# Patient Record
Sex: Male | Born: 1965 | Race: White | Hispanic: No | Smoking: Former smoker
Health system: Southern US, Community
[De-identification: ages and names within clinical notes are randomized; demographics above are authoritative.]

## PROBLEM LIST (undated history)

## (undated) DIAGNOSIS — F5104 Psychophysiologic insomnia: Secondary | ICD-10-CM

## (undated) DIAGNOSIS — M542 Cervicalgia: Secondary | ICD-10-CM

## (undated) DIAGNOSIS — J069 Acute upper respiratory infection, unspecified: Secondary | ICD-10-CM

## (undated) DIAGNOSIS — E291 Testicular hypofunction: Secondary | ICD-10-CM

## (undated) DIAGNOSIS — J45909 Unspecified asthma, uncomplicated: Secondary | ICD-10-CM

## (undated) DIAGNOSIS — L509 Urticaria, unspecified: Secondary | ICD-10-CM

## (undated) HISTORY — PX: TESTICLE TORSION REDUCTION: SHX795

## (undated) HISTORY — DX: Unspecified asthma, uncomplicated: J45.909

## (undated) HISTORY — DX: Urticaria, unspecified: L50.9

## (undated) HISTORY — PX: CYSTECTOMY: SUR359

## (undated) HISTORY — PX: ORCHIECTOMY: SHX2116

## (undated) HISTORY — DX: Acute upper respiratory infection, unspecified: J06.9

## (undated) HISTORY — DX: Psychophysiologic insomnia: F51.04

## (undated) HISTORY — DX: Testicular hypofunction: E29.1

## (undated) HISTORY — DX: Cervicalgia: M54.2

---

## 2003-12-03 ENCOUNTER — Encounter: Admission: RE | Admit: 2003-12-03 | Discharge: 2003-12-03 | Payer: Self-pay | Admitting: Internal Medicine

## 2003-12-11 ENCOUNTER — Encounter: Admission: RE | Admit: 2003-12-11 | Discharge: 2003-12-11 | Payer: Self-pay | Admitting: Internal Medicine

## 2005-02-16 ENCOUNTER — Ambulatory Visit: Payer: Self-pay | Admitting: Internal Medicine

## 2005-04-05 IMAGING — CR DG CERVICAL SPINE COMPLETE 4+V
5 series · 5 of 5 positions shown · non-contrast
Comparison: none

CLINICAL DATA: Assess for congenital anomaly.

[view not recorded (1 of 5)]
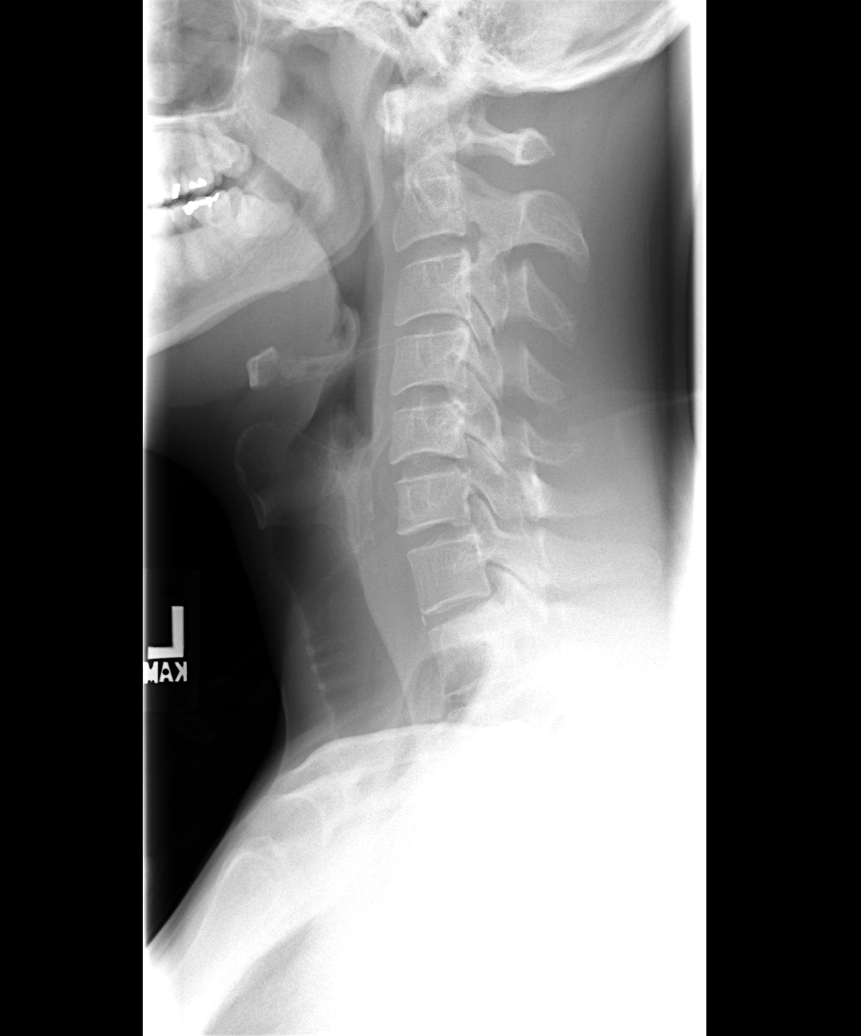

[view not recorded (2 of 5)]
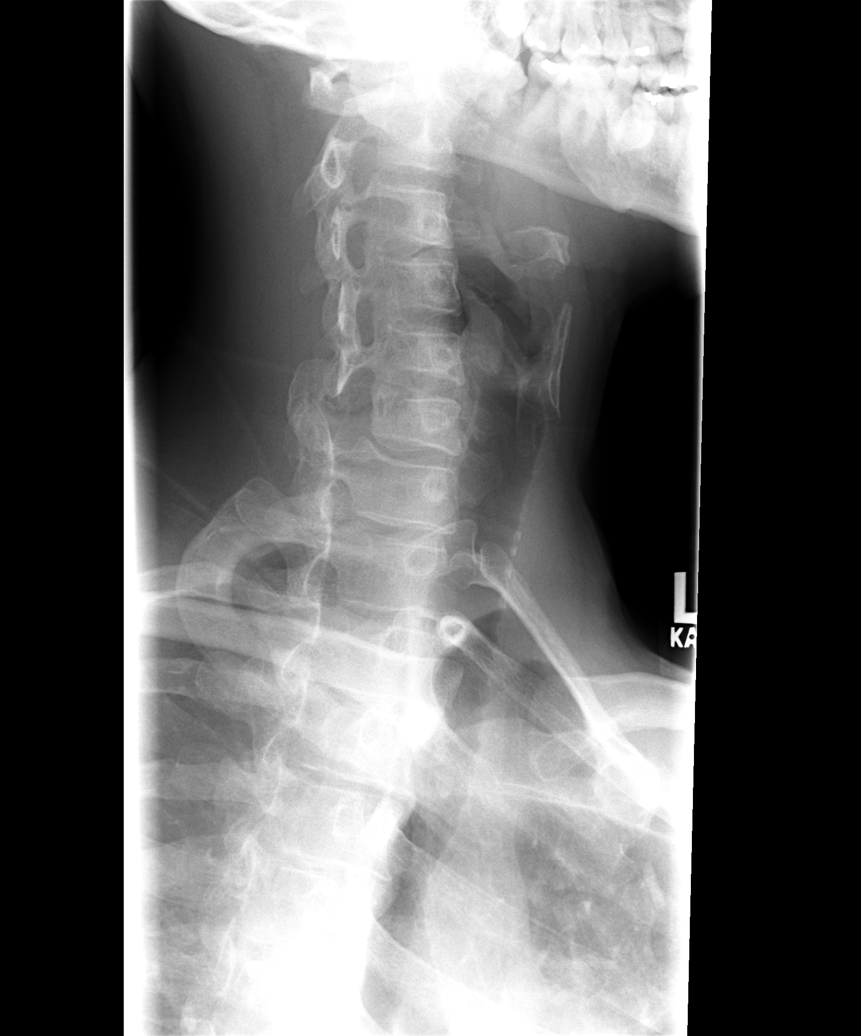

[view not recorded (3 of 5)]
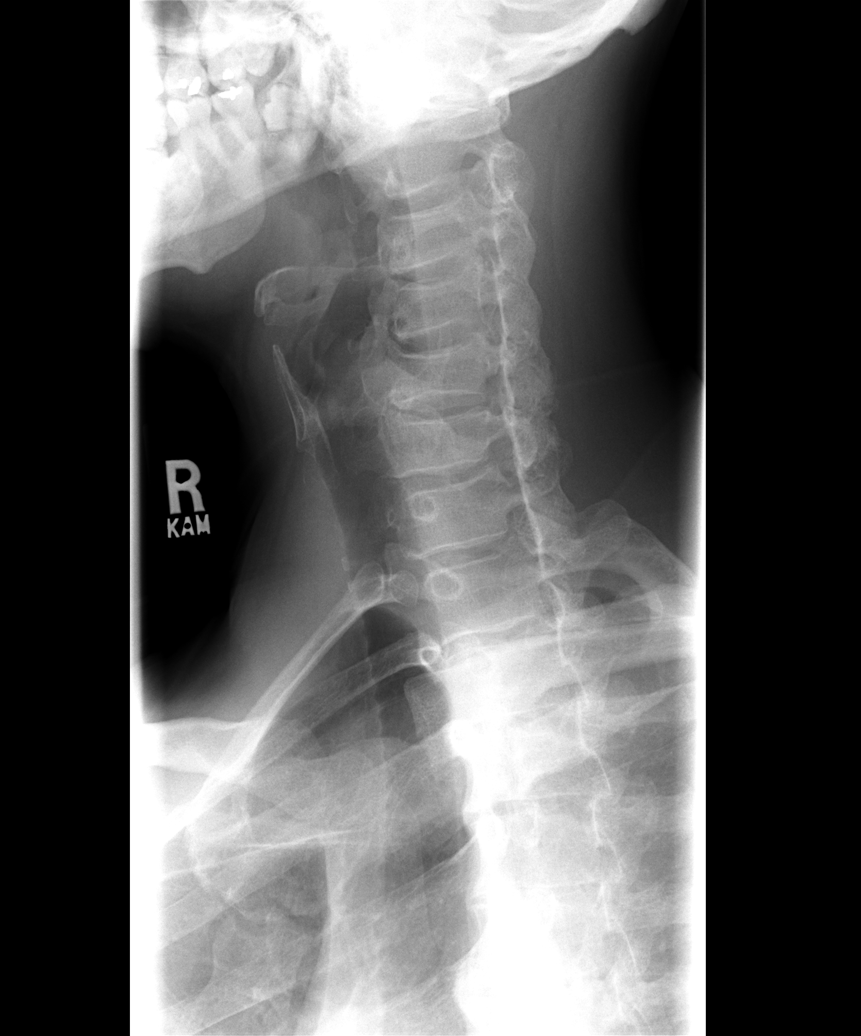

[view not recorded (4 of 5)]
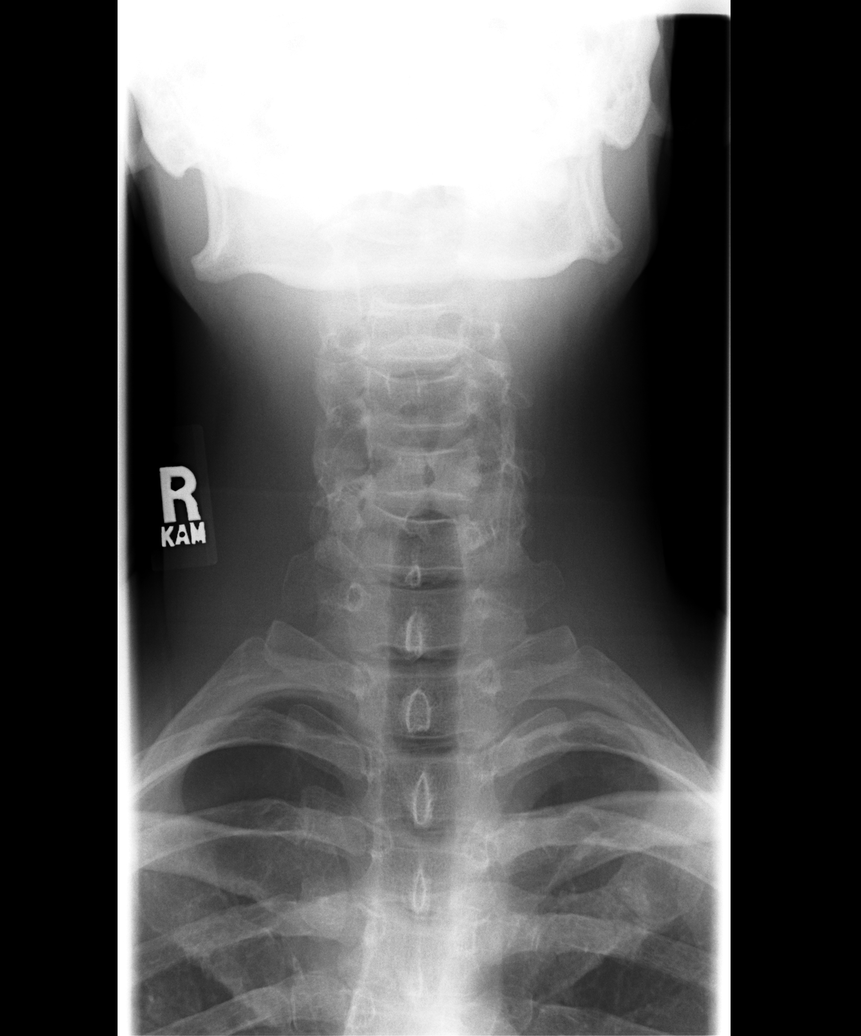

[view not recorded (5 of 5)]
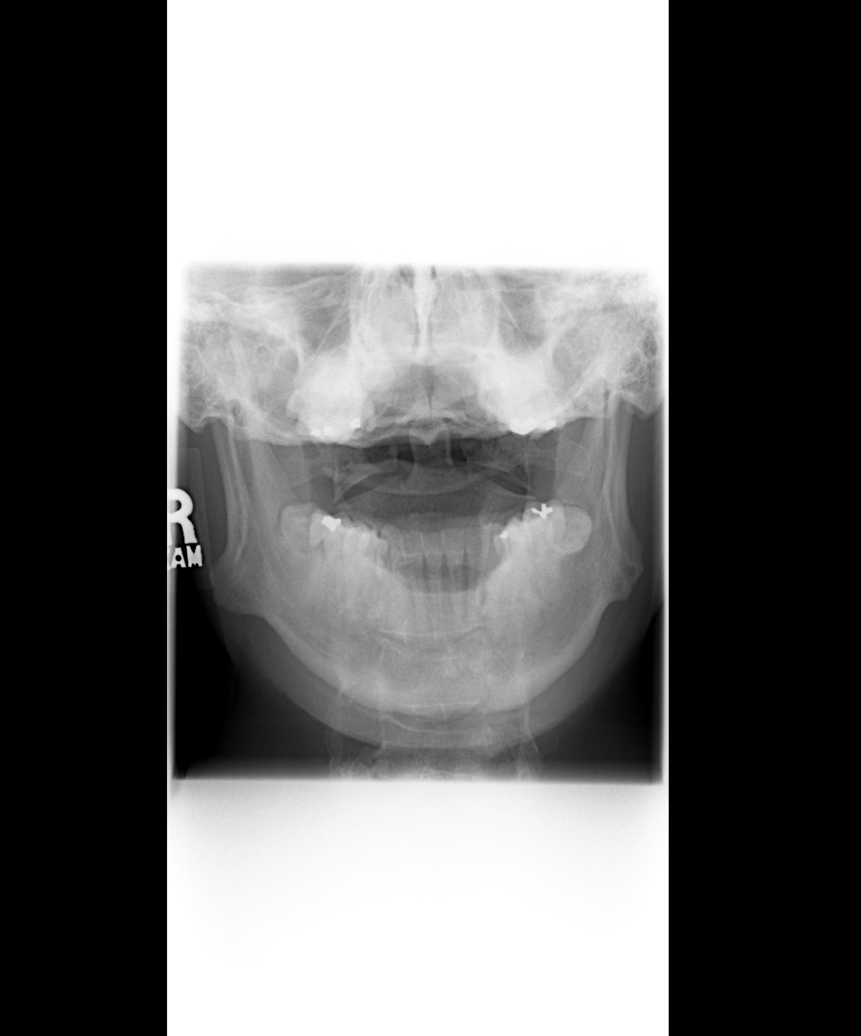

[5 of 5 positions shown; findings below may reference images not displayed]

CERVICAL SPINE ? FIVE VIEWS
As seen on the lateral and AP projections, alignment is normal.  No subluxations.  No disk space narrowing.  The patient has an absent right pedicle at C6.  The facet on the right at C5 articulates with the facet on the right at C7.  There is a giant foramen on the right through which the C6 and C7 nerve roots exit.  I think there are mild degenerative changes of the facets on the left at C5-6 and C6-7.  

IMPRESSION
Absent pedicle on the right at C6.  See above for full discussion.  No sign of bony canal or foraminal stenosis.

## 2010-07-10 ENCOUNTER — Ambulatory Visit: Payer: Self-pay | Admitting: Internal Medicine

## 2010-07-10 DIAGNOSIS — J302 Other seasonal allergic rhinitis: Secondary | ICD-10-CM

## 2010-07-10 DIAGNOSIS — J3089 Other allergic rhinitis: Secondary | ICD-10-CM

## 2010-07-11 ENCOUNTER — Telehealth: Payer: Self-pay | Admitting: Internal Medicine

## 2010-07-15 ENCOUNTER — Ambulatory Visit: Payer: Self-pay | Admitting: Internal Medicine

## 2010-07-15 LAB — CONVERTED CEMR LAB
AST: 29 units/L (ref 0–37)
Basophils Relative: 0.5 % (ref 0.0–3.0)
Bilirubin, Direct: 0.1 mg/dL (ref 0.0–0.3)
Calcium: 9 mg/dL (ref 8.4–10.5)
Cholesterol: 222 mg/dL — ABNORMAL HIGH (ref 0–200)
Eosinophils Absolute: 0.6 10*3/uL (ref 0.0–0.7)
Eosinophils Relative: 10.5 % — ABNORMAL HIGH (ref 0.0–5.0)
HCT: 45.3 % (ref 39.0–52.0)
Hemoglobin: 15.8 g/dL (ref 13.0–17.0)
Lymphocytes Relative: 37.1 % (ref 12.0–46.0)
Monocytes Relative: 10.8 % (ref 3.0–12.0)
Neutro Abs: 2.4 10*3/uL (ref 1.4–7.7)
Potassium: 5.1 meq/L (ref 3.5–5.1)
RDW: 11.8 % (ref 11.5–14.6)
Sodium: 137 meq/L (ref 135–145)
Specific Gravity, Urine: <=1.005 (ref 1.000–1.030)
TSH: 1.45 microintl units/mL (ref 0.35–5.50)
Total Protein, Urine: NEGATIVE mg/dL
Total Protein: 6.7 g/dL (ref 6.0–8.3)
Urobilinogen, UA: 0.2 (ref 0.0–1.0)

## 2010-07-18 ENCOUNTER — Encounter: Payer: Self-pay | Admitting: Internal Medicine

## 2010-07-18 ENCOUNTER — Ambulatory Visit: Payer: Self-pay | Admitting: Internal Medicine

## 2010-07-18 DIAGNOSIS — R5383 Other fatigue: Secondary | ICD-10-CM

## 2010-07-18 DIAGNOSIS — R5381 Other malaise: Secondary | ICD-10-CM

## 2010-07-18 DIAGNOSIS — G4700 Insomnia, unspecified: Secondary | ICD-10-CM

## 2010-07-23 LAB — CONVERTED CEMR LAB: Testosterone: 257.74 ng/dL — ABNORMAL LOW (ref 350.00–890.00)

## 2010-09-02 ENCOUNTER — Ambulatory Visit
Admission: RE | Admit: 2010-09-02 | Discharge: 2010-09-02 | Payer: Self-pay | Source: Home / Self Care | Attending: Internal Medicine | Admitting: Internal Medicine

## 2010-09-15 ENCOUNTER — Ambulatory Visit: Admit: 2010-09-15 | Payer: Self-pay | Admitting: Internal Medicine

## 2010-09-26 ENCOUNTER — Telehealth (INDEPENDENT_AMBULATORY_CARE_PROVIDER_SITE_OTHER): Payer: Self-pay | Admitting: *Deleted

## 2010-09-30 NOTE — Assessment & Plan Note (Signed)
Summary: ALLERGIES-LB   Vital Signs:  Patient profile:   45 year old male Weight:      201 pounds O2 Sat:      98 % on Room air Temp:     97.5 degrees F oral Pulse rate:   63 / minute BP sitting:   128 / 70  (left arm) Cuff size:   regular  Vitals Entered By: Alysia Penna (July 10, 2010 4:35 PM)  O2 Flow:  Room air CC: pt c/o worsening allergies along w/ congestion and sneezing.    CC:  pt c/o worsening allergies along w/ congestion and sneezing. Marland Kitchen  History of Present Illness: Not seen since 2006 C/o allergies - c/o allergies. He has been having severe runny nose episodes at home and outside - worse lately. No wheezing.  Preventive Screening-Counseling & Management  Alcohol-Tobacco     Smoking Status: never  Current Medications (verified): 1)  None  Allergies (verified): No Known Drug Allergies  Past History:  Past Medical History: Allergic rhinitis H/o neck pain  Past Surgical History: Cyst removal  Social History: Occupation:mortgage Electrical engineer Single Never Smoked Alcohol use-yes Smoking Status:  never  Review of Systems  The patient denies anorexia, fever, weight loss, weight gain, vision loss, decreased hearing, hoarseness, chest pain, syncope, dyspnea on exertion, peripheral edema, prolonged cough, headaches, hemoptysis, suspicious skin lesions, enlarged lymph nodes, angioedema, and testicular masses.    Physical Exam  General:  Well-developed,well-nourished,in no acute distress; alert,appropriate and cooperative throughout examination Eyes:  No corneal or conjunctival inflammation noted. EOMI. Perrla.  Ears:  External ear exam shows no significant lesions or deformities.  Otoscopic examination reveals clear canals, tympanic membranes are intact bilaterally without bulging, retraction, inflammation or discharge. Hearing is grossly normal bilaterally. Nose:  Swollen nasal mucosa B with clear d/c Mouth:  Oral mucosa and oropharynx without  lesions or exudates.  Teeth in good repair. Neck:  No deformities, masses, or tenderness noted. Lungs:  Normal respiratory effort, chest expands symmetrically. Lungs are clear to auscultation, no crackles or wheezes. Heart:  Normal rate and regular rhythm. S1 and S2 normal without gallop, murmur, click, rub or other extra sounds. Abdomen:  Bowel sounds positive,abdomen soft and non-tender without masses, organomegaly or hernias noted. Skin:  Intact without suspicious lesions or rashes Psych:  Cognition and judgment appear intact. Alert and cooperative with normal attention span and concentration. No apparent delusions, illusions, hallucinations   Impression & Recommendations:  Problem # 1:  ALLERGIC RHINITIS (ICD-477.9) Assessment Deteriorated  His updated medication list for this problem includes:    Flonase 50 Mcg/act Susp (Fluticasone propionate) .Marland Kitchen... 1 spr each nostr qd as needed    Sudafed 12 Hour 120 Mg Xr12h-tab (Pseudoephedrine hcl) .Marland Kitchen... 1 by mouth two times a day as needed allergies    Zyrtec Allergy 10 Mg Tabs (Cetirizine hcl) .Marland Kitchen... 1 by mouth once daily Zpac if worse See "Patient Instructions".   Complete Medication List: 1)  Flonase 50 Mcg/act Susp (Fluticasone propionate) .Marland Kitchen.. 1 spr each nostr qd as needed 2)  Sudafed 12 Hour 120 Mg Xr12h-tab (Pseudoephedrine hcl) .Marland Kitchen.. 1 by mouth two times a day as needed allergies 3)  Zyrtec Allergy 10 Mg Tabs (Cetirizine hcl) .Marland Kitchen.. 1 by mouth qd 4)  Sudafed 12 Hour 120 Mg Xr12h-tab (Pseudoephedrine hcl) .Marland Kitchen.. 1 by mouth two times a day as needed allergies 5)  Zithromax Z-pak 250 Mg Tabs (Azithromycin) .... As dirrected  Patient Instructions: 1)  Keep return office visit  2)  Labs for well exam - please add Allergy profile region 2 Dx 477 3)  Irrigate sinuses Prescriptions: ZITHROMAX Z-PAK 250 MG TABS (AZITHROMYCIN) as dirrected  #1 x 0   Entered and Authorized by:   Tresa Garter MD   Signed by:   Tresa Garter MD on  07/10/2010   Method used:   Print then Give to Patient   RxID:   [id number] SUDAFED 12 HOUR 120 MG XR12H-TAB (PSEUDOEPHEDRINE HCL) 1 by mouth two times a day as needed allergies  #60 x 1   Entered and Authorized by:   Tresa Garter MD   Signed by:   Tresa Garter MD on 07/10/2010   Method used:   Print then Give to Patient   RxID:   [id number] SUDAFED 12 HOUR 120 MG XR12H-TAB (PSEUDOEPHEDRINE HCL) 1 by mouth two times a day as needed allergies  #60 x 1   Entered and Authorized by:   Tresa Garter MD   Signed by:   Tresa Garter MD on 07/10/2010   Method used:   Print then Give to Patient   RxID:   [id number] FLONASE 50 MCG/ACT SUSP (FLUTICASONE PROPIONATE) 1 spr each nostr qd as needed  #3 x 3   Entered and Authorized by:   Tresa Garter MD   Signed by:   Tresa Garter MD on 07/10/2010   Method used:   Print then Give to Patient   RxID:   [id number]    Orders Added: 1)  New Patient Level II [06301]

## 2010-09-30 NOTE — Progress Notes (Signed)
Summary: PHARM  Phone Note From Pharmacy   Caller: [facility] Summary of Call: Pharm called, Pt also wants rx for sudafed (non 12 hour) Initial call taken by: Lamar Sprinkles, CMA,  July 11, 2010 11:28 AM  Follow-up for Phone Call        ok Follow-up by: Tresa Garter MD,  July 11, 2010 1:31 PM    New/Updated Medications: SUDAFED 30 MG TABS (PSEUDOEPHEDRINE HCL) 1-2 by mouth three times a day as needed congestion Prescriptions: SUDAFED 30 MG TABS (PSEUDOEPHEDRINE HCL) 1-2 by mouth three times a day as needed congestion  #100 x 3   Entered by:   Lamar Sprinkles, CMA   Authorized by:   Tresa Garter MD   Signed by:   Lamar Sprinkles, CMA on 07/11/2010   Method used:   Electronically to        [facility]  [phone]* (retail)       [address]       [place], [place]  [address]       Ph: [phone] or [phone]       Fax: [phone]   RxID:   [id number]

## 2010-09-30 NOTE — Assessment & Plan Note (Signed)
Summary: cpx-lb   Vital Signs:  Patient profile:   45 year old male Height:      69 inches Weight:      203 pounds BMI:     30.09 Temp:     98.5 degrees F oral Pulse rate:   68 / minute Pulse rhythm:   regular Resp:     16 per minute BP sitting:   120 / 72  (left arm) Cuff size:   regular  Vitals Entered By: Lanier Prude, Beverly Gust) (July 18, 2010 10:47 AM) CC: CPX Is Patient Diabetic? No   CC:  CPX.  History of Present Illness: The patient presents for a preventive health examination C/o insomnia lately   Current Medications (verified): 1)  Flonase 50 Mcg/act Susp (Fluticasone Propionate) .Marland Kitchen.. 1 Spr Each Nostr Qd As Needed 2)  Sudafed 12 Hour 120 Mg Xr12h-Tab (Pseudoephedrine Hcl) .Marland Kitchen.. 1 By Mouth Two Times A Day As Needed Allergies 3)  Zyrtec Allergy 10 Mg Tabs (Cetirizine Hcl) .Marland Kitchen.. 1 By Mouth Qd 4)  Sudafed 12 Hour 120 Mg Xr12h-Tab (Pseudoephedrine Hcl) .Marland Kitchen.. 1 By Mouth Two Times A Day As Needed Allergies 5)  Sudafed 30 Mg Tabs (Pseudoephedrine Hcl) .Marland Kitchen.. 1-2 By Mouth Three Times A Day As Needed Congestion  Allergies (verified): No Known Drug Allergies  Past History:  Past Medical History: Last updated: 07/10/2010 Allergic rhinitis H/o neck pain  Past Surgical History: Cyst removal Testicular torsion L; s/p orchiectomy  Family History: M CAD (G Burk[place]ter)  Social History: Occupation:mortgage Electrical engineer Single lives w/girlfriend (cats and a dod in the house) Never Smoked Alcohol use-yes  Review of Systems  The patient denies anorexia, fever, weight loss, weight gain, vision loss, decreased hearing, hoarseness, chest pain, syncope, dyspnea on exertion, peripheral edema, prolonged cough, headaches, hemoptysis, abdominal pain, melena, hematochezia, severe indigestion/heartburn, hematuria, incontinence, genital sores, muscle weakness, suspicious skin lesions, transient blindness, difficulty walking, depression, unusual weight change, abnormal bleeding,  enlarged lymph nodes, angioedema, and breast masses.    Physical Exam  General:  Well-developed,well-nourished,in no acute distress; alert,appropriate and cooperative throughout examination Head:  Normocephalic and atraumatic without obvious abnormalities. No apparent alopecia or balding. Eyes:  No corneal or conjunctival inflammation noted. EOMI. Perrla.  Ears:  External ear exam shows no significant lesions or deformities.  Otoscopic examination reveals clear canals, tympanic membranes are intact bilaterally without bulging, retraction, inflammation or discharge. Hearing is grossly normal bilaterally. Nose:  Swollen nasal mucosa B with clear d/c - better Mouth:  Oral mucosa and oropharynx without lesions or exudates.  Teeth in good repair. Neck:  No deformities, masses, or tenderness noted. Lungs:  Normal respiratory effort, chest expands symmetrically. Lungs are clear to auscultation, no crackles or wheezes. Heart:  Normal rate and regular rhythm. S1 and S2 normal without gallop, murmur, click, rub or other extra sounds. Abdomen:  Bowel sounds positive,abdomen soft and non-tender without masses, organomegaly or hernias noted. Msk:  No deformity or scoliosis noted of thoracic or lumbar spine.   Pulses:  R and L carotid,radial,femoral,dorsalis pedis and posterior tibial pulses are full and equal bilaterally Neurologic:  No cranial nerve deficits noted. Station and gait are normal. Plantar reflexes are down-going bilaterally. DTRs are symmetrical throughout. Sensory, motor and coordinative functions appear intact. Skin:  Intact without suspicious lesions or rashes Cervical Nodes:  No lymphadenopathy noted Inguinal Nodes:  No significant adenopathy Psych:  Cognition and judgment appear intact. Alert and cooperative with normal attention span and concentration. No apparent delusions, illusions, hallucinations  Impression & Recommendations:  Problem # 1:  ROUTINE GENERAL MEDICAL EXAM@HEALTH   CARE FACL (ICD-V70.0) Assessment New Health and age related issues were discussed. Available screening tests and vaccinations were discussed as well. Healthy life style including good diet and exercise was discussed.  The labs were reviewed with the patient.  Orders: EKG w/ Interpretation (93000) OK  Problem # 2:  ALLERGIC RHINITIS (ICD-477.9) Assessment: Improved Use Benadryl at hs Sinus rinse The following medications were removed from the medication list:    Sudafed 12 Hour 120 Mg Xr12h-tab (Pseudoephedrine hcl) .Marland Kitchen... 1 by mouth two times a day as needed allergies His updated medication list for this problem includes:    Flonase 50 Mcg/act Susp (Fluticasone propionate) .Marland Kitchen... 1 spr each nostr qd as needed    Zyrtec Allergy 10 Mg Tabs (Cetirizine hcl) .Marland Kitchen... 1 by mouth qd    Sudafed 12 Hour 120 Mg Xr12h-tab (Pseudoephedrine hcl) .Marland Kitchen... 1 by mouth two times a day as needed allergies    Sudafed 30 Mg Tabs (Pseudoephedrine hcl) .Marland Kitchen... 1-2 by mouth three times a day as needed congestion    Diphenhydramine Hcl 25 Mg Caps (Diphenhydramine hcl) .Marland Kitchen... 1 at hs  Orders: Pulmonary Referral (Pulmonary)  Problem # 3:  INSOMNIA, CHRONIC (ICD-307.42) Assessment: New Use Benadryl at hs prn  Complete Medication List: 1)  Flonase 50 Mcg/act Susp (Fluticasone propionate) .Marland Kitchen.. 1 spr each nostr qd as needed 2)  Zyrtec Allergy 10 Mg Tabs (Cetirizine hcl) .Marland Kitchen.. 1 by mouth qd 3)  Sudafed 12 Hour 120 Mg Xr12h-tab (Pseudoephedrine hcl) .Marland Kitchen.. 1 by mouth two times a day as needed allergies 4)  Sudafed 30 Mg Tabs (Pseudoephedrine hcl) .Marland Kitchen.. 1-2 by mouth three times a day as needed congestion 5)  Singulair 10 Mg Tabs (Montelukast sodium) .Marland Kitchen.. 1 by mouth qd 6)  Diphenhydramine Hcl 25 Mg Caps (Diphenhydramine hcl) .Marland Kitchen.. 1 at hs 7)  Aspirin 81 Mg Tbec (Aspirin) .Marland Kitchen.. 1 by mouth qd  Other Orders: TLB-Testosterone, Total (84403-TESTO) Flu Vaccine 9yrs + (57846) Admin 1st Vaccine (96295)  Patient Instructions: 1)   Please schedule a follow-up appointment in 6 months. 2)  BMP prior to visit, ICD-9: 3)  Lipid Panel prior to visit, ICD-9: 272.0 Prescriptions: SINGULAIR 10 MG TABS (MONTELUKAST SODIUM) 1 by mouth qd  #30 x 11   Entered and Authorized by:   Tresa Garter MD   Signed by:   Tresa Garter MD on 07/18/2010   Method used:   Print then Give to Patient   RxID:   [id number]    Orders Added: 1)  EKG w/ Interpretation [93000] 2)  TLB-Testosterone, Total [84403-TESTO] 3)  Flu Vaccine 76yrs + [90658] 4)  Admin 1st Vaccine [90471] 5)  Pulmonary Referral [Pulmonary] 6)  Est. Patient 40-64 years [99396]   Immunizations Administered:  Influenza Vaccine # 1:    Vaccine Type: Fluvax 3+    Site: left deltoid    Mfr: Sanofi Pasteur    Dose: 0.5 ml    Route: IM    Given by: Lanier Prude, CMA(AAMA)    Exp. Date: 02/28/2011    Lot #: [id number]    VIS given: 03/25/10 version given July 18, 2010.   Immunizations Administered:  Influenza Vaccine # 1:    Vaccine Type: Fluvax 3+    Site: left deltoid    Mfr: Sanofi Pasteur    Dose: 0.5 ml    Route: IM    Given by: Lanier Prude, CMA(AAMA)    Exp. Date: 02/28/2011  Lot #: [id number]    VIS given: 03/25/10 version given July 18, 2010.

## 2010-10-01 ENCOUNTER — Encounter: Payer: Self-pay | Admitting: Internal Medicine

## 2010-10-01 ENCOUNTER — Ambulatory Visit (INDEPENDENT_AMBULATORY_CARE_PROVIDER_SITE_OTHER): Payer: BC Managed Care – PPO | Admitting: Internal Medicine

## 2010-10-01 ENCOUNTER — Ambulatory Visit: Admit: 2010-10-01 | Payer: Self-pay | Admitting: Internal Medicine

## 2010-10-01 DIAGNOSIS — G4700 Insomnia, unspecified: Secondary | ICD-10-CM

## 2010-10-01 DIAGNOSIS — J309 Allergic rhinitis, unspecified: Secondary | ICD-10-CM

## 2010-10-02 DIAGNOSIS — J301 Allergic rhinitis due to pollen: Secondary | ICD-10-CM

## 2010-10-02 NOTE — Assessment & Plan Note (Signed)
Summary: severe allergies/mhh   Vital Signs:  Patient profile:   45 year old male Height:      69 inches Weight:      201.13 pounds BMI:     29.81 O2 Sat:      100 % on Room air Pulse rate:   65 / minute BP sitting:   108 / 80  (left arm) Cuff size:   large  Vitals Entered By: Reynaldo Minium CMA (September 02, 2010 10:07 AM)  O2 Flow:  Room air   Primary Provider/Referring Provider:  Plotnikov   History of Present Illness: September 02, 2010-  [name] seen on kind referral from Dr Posey Rea with concern about allergy.  Former smoker who describes onset of seasonal allergy in his 44's. Gradually more perennial. For several years had recurrent sinus infections, butthat pattern has faded. Describes nasal congestion and some sneeze with watery eyes. Has been taking daily zyrtec x 2 years and wih exacerbation will take 2 daily. Temperature significant watery rhinorhea and sneeze. 3-4 episodes each year put hnim to bed. Clairitin used to help but no longer. Occasional minimal dyspnea and wheezes a little around animals. Suspects his paet cats may aggravate existing symptoms. Has Neti pot and flonase with limited benefit. Given Singulair but afraid to try it because of report of mood change. Sudafed some help, but significantly aggravates chronic insomnia. Describes difficulty initiating and maintaining sleep, then sometimes drowsy in day and irritable.  Denies problems with latex, aspirin, foods, insect stings.  Lives in a house, no basement, carpeted with no mold. 3 cats- dog recently died. Air purifiers at home and at work.  Allergy profile  07/15/10- broadly elevated for common inhalent allergens, with total IgE high at 945.7.  Preventive Screening-Counseling & Management  Alcohol-Tobacco     Smoking Status: quit     Packs/Day: 1.0     Year Started: age 31     Year Quit: 2006  Current Medications (verified): 1)  Flonase 50 Mcg/act Susp (Fluticasone Propionate) .Marland Kitchen.. 1 Spr Each Nostr Qd As  Needed 2)  Zyrtec Allergy 10 Mg Tabs (Cetirizine Hcl) .Marland Kitchen.. 1 By Mouth Qd 3)  Sudafed 30 Mg Tabs (Pseudoephedrine Hcl) .Marland Kitchen.. 1-2 By Mouth Three Times A Day As Needed Congestion 4)  Singulair 10 Mg Tabs (Montelukast Sodium) .Marland Kitchen.. 1 By Mouth Qd 5)  Diphenhydramine Hcl 25 Mg Caps (Diphenhydramine Hcl) .Marland Kitchen.. 1 At At Bedtime As Needed 6)  Aspirin 81 Mg Tbec (Aspirin) .Marland Kitchen.. 1 By Mouth Qd  Allergies (verified): No Known Drug Allergies  Family History: M CAD (G Burkhalter)  Family hx of Seasonal Allergies Heart Disease: mother-quad bypass, maternal grandmother-Mi and stroke.  Social History: Occupation:mortgage Electrical engineer Single lives w/girlfriend (cats in the house) Alcohol use-yes Patient states former smoker. States he quit approx 2006. Smoking Status:  quit Packs/Day:  1.0  Review of Systems      See HPI       The patient complains of shortness of breath with activity, shortness of breath at rest, headaches, and nasal congestion/difficulty breathing through nose.  The patient denies productive cough, non-productive cough, coughing up blood, chest pain, irregular heartbeats, acid heartburn, indigestion, loss of appetite, weight change, abdominal pain, difficulty swallowing, sore throat, tooth/dental problems, sneezing, itching, ear ache, anxiety, depression, hand/feet swelling, joint stiffness or pain, rash, change in color of mucus, and fever.    Physical Exam  Additional Exam:  General: A/Ox3; pleasant and cooperative, NAD, wdwn SKIN: no rash, lesions NODES:  no lymphadenopathy HEENT: [place]/AT, EOM- WNL, Conjuctivae- clear, PERRLA, TM-WNL, Nose- mucus bridging, Throat- clear and wnl, Mallampati  III, no pnd NECK: Supple w/ fair ROM, JVD- none, normal carotid impulses w/o bruits Thyroid- normal to palpation CHEST: Clear to P&A HEART: RRR, no m/g/r heard ABDOMEN: Soft and nl; nml bowel sounds; no organomegaly or masses noted WGN:FAOZ, nl pulses, no edema  NEURO: Grossly intact to  observation      Impression & Recommendations:  Problem # 1:  ALLERGIC RHINITIS (ICD-477.9) Significant hx c/w allergic rhinits which has changed from seasonal to perennial. So far no asthma, but hints this may still appear. We will bring him back for allergy skin testing.  The following medications were removed from the medication list:    Sudafed 12 Hour 120 Mg Xr12h-tab (Pseudoephedrine hcl) .Marland Kitchen... 1 by mouth two times a day as needed allergies His updated medication list for this problem includes:    Flonase 50 Mcg/act Susp (Fluticasone propionate) .Marland Kitchen... 1 spr each nostr qd as needed    Zyrtec Allergy 10 Mg Tabs (Cetirizine hcl) .Marland Kitchen... 1 by mouth qd    Sudafed 30 Mg Tabs (Pseudoephedrine hcl) .Marland Kitchen... 1-2 by mouth three times a day as needed congestion    Diphenhydramine Hcl 25 Mg Caps (Diphenhydramine hcl) .Marland Kitchen... 1 at at bedtime as needed  Problem # 2:  INSOMNIA, CHRONIC (ICD-307.42) We discussed this today only to the extent that nasal discomfort and stimulant decongestants might be pertinent. Benadryl at night didn't work well.   Medications Added to Medication List This Visit: 1)  Diphenhydramine Hcl 25 Mg Caps (Diphenhydramine hcl) .Marland Kitchen.. 1 at at bedtime as needed  Other Orders: Consultation Level IV (30865)  Patient Instructions: 1)  Return as able for allergy skin testing. Stop all antihistamines 3 days before skin testing, including cold and allergy meds, otc sleep and cough meds.  2)  Consider resuming your flonase/ fluticasone regularly  3)    1-2 puffs each nostril, one daily at bedtime 4)  Consider otc nasal spray Nasalcrom/ cromolyn   Orders Added: 1)  Consultation Level IV [78469]

## 2010-10-02 NOTE — Progress Notes (Signed)
Summary: meds/ allergy testing  Phone Note Call from Patient Call back at Home Phone [phone]   Caller: Patient Call For: young Summary of Call: pt wants to know if he can continue taking the following medicines prior to allergy testing next week: sudafed/ melatonin / flonase/ and nasalcrom. pt # [id number] Initial call taken by: Tivis Ringer, CNA,  September 26, 2010 8:57 AM  Follow-up for Phone Call        Pt instructed not to take sudafed, flonase or nasalcrom 3 days prior to allergy testing.  Melatonin is ok. Abigail Miyamoto RN  September 26, 2010 9:44 AM

## 2010-10-08 NOTE — Assessment & Plan Note (Signed)
Summary: alt/sh  Aware of allergy protocol   Vital Signs:  Patient profile:   45 year old male Height:      69 inches Weight:      195.38 pounds BMI:     28.96 O2 Sat:      98 % on Room air Pulse rate:   76 / minute BP sitting:   118 / 68  (left arm) Cuff size:   regular  Vitals Entered By: Reynaldo Minium CMA (October 01, 2010 3:30 PM)  O2 Flow:  Room air CC: Allergy Testing.   Primary Provider/Referring Provider:  Plotnikov  CC:  Allergy Testing.Marland Kitchen  History of Present Illness: History of Present Illness: September 02, 2010-  [name] seen on kind referral from Dr Posey Rea with concern about allergy.  Former smoker who describes onset of seasonal allergy in his 45's. Gradually more perennial. For several years had recurrent sinus infections, butthat pattern has faded. Describes nasal congestion and some sneeze with watery eyes. Has been taking daily zyrtec x 2 years and wih exacerbation will take 2 daily. Temperature significant watery rhinorhea and sneeze. 3-4 episodes each year put hnim to bed. Clairitin used to help but no longer. Occasional minimal dyspnea and wheezes a little around animals. Suspects his paet cats may aggravate existing symptoms. Has Neti pot and flonase with limited benefit. Given Singulair but afraid to try it because of report of mood change. Sudafed some help, but significantly aggravates chronic insomnia. Describes difficulty initiating and maintaining sleep, then sometimes drowsy in day and irritable.  Denies problems with latex, aspirin, foods, insect stings.  Lives in a house, no basement, carpeted with no mold. 3 cats- dog recently died. Air purifiers at home and at work.  Allergy profile  07/15/10- broadly elevated for common inhalent allergens, with total IgE high at 945.7.  October 01, 2010- Allergic rhinitis Nurse-CC: Allergy Testing. Flonase seems to give him headache. Nasalcrom and daily Zyrtec seem to help. No recent cold. Feels frontal sinus  headache and increasing postnasal drip off Zyrtec. Some throat clearing but no cough.  Skin test-   Preventive Screening-Counseling & Management  Alcohol-Tobacco     Smoking Status: quit     Packs/Day: 1.0     Year Started: age 45     Year Quit: 2006  Current Medications (verified): 1)  Zyrtec Allergy 10 Mg Tabs (Cetirizine Hcl) .Marland Kitchen.. 1 By Mouth Qd 2)  Sudafed 30 Mg Tabs (Pseudoephedrine Hcl) .Marland Kitchen.. 1-2 By Mouth Three Times A Day As Needed Congestion 3)  Singulair 10 Mg Tabs (Montelukast Sodium) .Marland Kitchen.. 1 By Mouth Qd 4)  Diphenhydramine Hcl 25 Mg Caps (Diphenhydramine Hcl) .Marland Kitchen.. 1 At At Bedtime As Needed 5)  Nasalcrom 5.2 Mg/act Aers (Cromolyn Sodium) .Marland Kitchen.. 1 Spray in Each Nostril Two Times A Day As Needed  Allergies (verified): 1)  ! * Flonase  Past History:  Past Medical History: Last updated: 07/10/2010 Allergic rhinitis H/o neck pain  Past Surgical History: Last updated: 07/18/2010 Cyst removal Testicular torsion L; s/p orchiectomy  Family History: Last updated: 09/02/2010 M CAD (G Burk[place])  Family hx of Seasonal Allergies Heart Disease: mother-quad bypass, maternal grandmother-Mi and stroke.  Social History: Last updated: 09/02/2010 Occupation:mortgage co manager Single lives w/girlfriend (cats in the house) Alcohol use-yes Patient states former smoker. States he quit approx 2006.  Risk Factors: Smoking Status: quit (10/01/2010) Packs/Day: 1.0 (10/01/2010)  Review of Systems      See HPI       The  patient complains of headaches, nasal congestion/difficulty breathing through nose, and sneezing.  The patient denies shortness of breath with activity, shortness of breath at rest, productive cough, non-productive cough, coughing up blood, chest pain, irregular heartbeats, acid heartburn, indigestion, loss of appetite, weight change, abdominal pain, difficulty swallowing, sore throat, tooth/dental problems, ear ache, hand/feet swelling, joint stiffness or pain, rash,  change in color of mucus, and fever.    Physical Exam  Additional Exam:  General: A/Ox3; pleasant and cooperative, NAD, wdwn SKIN: no rash, lesions NODES: no lymphadenopathy HEENT: /AT, EOM- WNL, Conjuctivae- clear, PERRLA, TM-WNL, Nose- mucus bridging, Throat- clear and wnl, Mallampati  III, no pnd NECK: Supple w/ fair ROM, JVD- none, normal carotid impulses w/o bruits Thyroid-  CHEST: Clear to P&A HEART: RRR, no m/g/r heard ABDOMEN: medium build EAV:WUJW, nl pulses, no edema  NEURO: Grossly intact to observation      Impression & Recommendations:  Problem # 1:  ALLERGIC RHINITIS (ICD-477.9)  The following medications were removed from the medication list:    Flonase 50 Mcg/act Susp (Fluticasone propionate) .Marland Kitchen... 1 spr each nostr qd as needed His updated medication list for this problem includes:    Zyrtec Allergy 10 Mg Tabs (Cetirizine hcl) .Marland Kitchen... 1 by mouth qd    Sudafed 30 Mg Tabs (Pseudoephedrine hcl) .Marland Kitchen... 1-2 by mouth three times a day as needed congestion    Diphenhydramine Hcl 25 Mg Caps (Diphenhydramine hcl) .Marland Kitchen... 1 at at bedtime as needed    Nasalcrom 5.2 Mg/act Aers (Cromolyn sodium) .Marland Kitchen... 1 spray in each nostril two times a day as needed  Medications Added to Medication List This Visit: 1)  Nasalcrom 5.2 Mg/act Aers (Cromolyn sodium) .Marland Kitchen.. 1 spray in each nostril two times a day as needed      Appended Document: alt/sh  Aware of allergy protocol     Primary Provider/Referring Provider:  Plotnikov   History of Present Illness: BP sitting:   118 / 68  (left arm) Cuff size:   regular Vitals Entered By: Reynaldo Minium CMA (October 01, 2010 3:30 PM) O2 Flow:  Room air CC: Allergy Testing. Primary Provider/Referring Provider:  Plotnikov CC:  Allergy Testing.Marland Kitchen History of Present Illness: September 02, 2010-  [name] seen on kind referral from Dr Posey Rea with concern about allergy.  Former smoker who describes onset of seasonal allergy in his 45's.  Gradually more perennial. For several years had recurrent sinus infections, butthat pattern has faded. Describes nasal congestion and some sneeze with watery eyes. Has been taking daily zyrtec x 2 years and wih exacerbation will take 2 daily. Temperature significant watery rhinorhea and sneeze. 3-4 episodes each year put hnim to bed. Clairitin used to help but no longer. Occasional minimal dyspnea and wheezes a little around animals. Suspects his paet cats may aggravate existing symptoms. Has Neti pot and flonase with limited benefit. Given Singulair but afraid to try it because of report of mood change. Sudafed some help, but significantly aggravates chronic insomnia. Describes difficulty initiating and maintaining sleep, then sometimes drowsy in day and irritable.  Denies problems with latex, aspirin, foods, insect stings.  Lives in a house, no basement, carpeted with no mold. 3 cats- dog recently died. Air purifiers at home and at work.  Allergy profile  07/15/10- broadly elevated for common inhalent allergens, with total IgE high at 945.7.  October 01, 2010- Allergic rhinitis Nurse-CC: Allergy Testing. Flonase seems to give him headache. Nasalcrom and daily Zyrtec seem to help. No recent  cold. Feels frontal sinus headache and increasing postnasal drip off Zyrtec. Some throat clearing but no cough.  Skin test- Broadly positive  Preventive Screening-Counseling & Management  Alcohol-Tobacco     Smoking Status: quit     Packs/Day: 1.0     Year Started: age 26     Year Quit: 2006  Allergies: 1)  ! * Flonase  Past History:  Past Medical History: Last updated: 07/10/2010 Allergic rhinitis H/o neck pain  Past Surgical History: Last updated: 07/18/2010 Cyst removal Testicular torsion L; s/p orchiectomy  Family History: Last updated: 09/02/2010 M CAD (G Burk[place]ter)  Family hx of Seasonal Allergies Heart Disease: mother-quad bypass, maternal grandmother-Mi and stroke.  Social  History: Last updated: 09/02/2010 Occupation:mortgage co manager Single lives w/girlfriend (cats in the house) Alcohol use-yes Patient states former smoker. States he quit approx 2006.  Risk Factors: Smoking Status: quit (10/01/2010) Packs/Day: 1.0 (10/01/2010)  Review of Systems      See HPI       The patient complains of nasal congestion/difficulty breathing through nose and sneezing.  The patient denies shortness of breath with activity, shortness of breath at rest, productive cough, non-productive cough, coughing up blood, chest pain, irregular heartbeats, acid heartburn, indigestion, loss of appetite, weight change, abdominal pain, difficulty swallowing, sore throat, tooth/dental problems, headaches, ear ache, hand/feet swelling, rash, change in color of mucus, and fever.    Physical Exam  Additional Exam:  General: A/Ox3; pleasant and cooperative, NAD, wdwn SKIN: no rash, lesions NODES: no lymphadenopathy HEENT: [place]/AT, EOM- WNL, Conjuctivae- clear, PERRLA, TM-WNL, Nose- mucus bridging, Throat- clear and wnl, Mallampati  III, no pnd NECK: Supple w/ fair ROM, JVD- none, normal carotid impulses w/o bruits Thyroid-  CHEST: Clear to P&A HEART: RRR, no m/g/r heard ABDOMEN: medium build UEA:VWUJ, nl pulses, no edema  NEURO: Grossly intact to observation      Impression & Recommendations:  Problem # 1:  ALLERGIC RHINITIS (ICD-477.9)  Significantly positve skin test reactions. Total IgE was 9545.7. Chronic rhinitis with little asthma.  His updated medication list for this problem includes:    Sudafed 30 Mg Tabs (Pseudoephedrine hcl) .Marland Kitchen... 1-2 by mouth three times a day as needed congestion    Zyrtec Allergy 10 Mg Tabs (Cetirizine hcl) .Marland Kitchen... 1 by mouth qd    Diphenhydramine Hcl 25 Mg Caps (Diphenhydramine hcl) .Marland Kitchen... 1 at at bedtime as needed    Nasalcrom 5.2 Mg/act Aers (Cromolyn sodium) .Marland Kitchen... 1 spray in each nostril two times a day as needed    Fluticasone Propionate 50  Mcg/act Susp (Fluticasone propionate) .Marland Kitchen... 1-2 puffs each nostril once daily at bedtime  Problem # 2:  INSOMNIA, CHRONIC (ICD-307.42)  He brings this up again. using melatonin and valerian. He still wakes 3-4 x/ night. difficulty initiating and maintaining sleep. Light sleep sensation. he has tried CBT online and it helps some. We discussed options and will let him try temazepam.   Medications Added to Medication List This Visit: 1)  Fluticasone Propionate 50 Mcg/act Susp (Fluticasone propionate) .Marland Kitchen.. 1-2 puffs each nostril once daily at bedtime 2)  Temazepam 15 Mg Caps (Temazepam) .Marland Kitchen.. 1-2 for sleep if needed  Other Orders: Est. Patient Level III (81191) Allergy Puncture Test (47829)  Patient Instructions: 1)  Please schedule a follow-up appointment in 2 months. 2)  We will start allergy vaccine based on skin test results today. The allergy lab will be calling you soon to set this up.  3)  Try script temazepam for sleep as needed  Prescriptions: FLUTICASONE PROPIONATE 50 MCG/ACT SUSP (FLUTICASONE PROPIONATE) 1-2 puffs each nostril once daily at bedtime  #1 x prn   Entered and Authorized by:   Waymon Budge MD   Signed by:   Waymon Budge MD on 10/01/2010   Method used:   Print then Give to Patient   RxID:   [id number] TEMAZEPAM 15 MG CAPS (TEMAZEPAM) 1-2 for sleep if needed  #30 x 5   Entered and Authorized by:   Waymon Budge MD   Signed by:   Waymon Budge MD on 10/01/2010   Method used:   Print then Give to Patient   RxID:   [id number]    Orders Added: 1)  Est. Patient Level III [84696] 2)  Allergy Puncture Test [95004]

## 2010-10-13 ENCOUNTER — Ambulatory Visit (INDEPENDENT_AMBULATORY_CARE_PROVIDER_SITE_OTHER): Payer: BC Managed Care – PPO

## 2010-10-13 DIAGNOSIS — J301 Allergic rhinitis due to pollen: Secondary | ICD-10-CM

## 2010-10-15 ENCOUNTER — Encounter (INDEPENDENT_AMBULATORY_CARE_PROVIDER_SITE_OTHER): Payer: Self-pay | Admitting: *Deleted

## 2010-10-15 ENCOUNTER — Other Ambulatory Visit: Payer: BC Managed Care – PPO

## 2010-10-15 ENCOUNTER — Other Ambulatory Visit: Payer: Self-pay | Admitting: Internal Medicine

## 2010-10-15 DIAGNOSIS — E7800 Pure hypercholesterolemia, unspecified: Secondary | ICD-10-CM

## 2010-10-15 LAB — BASIC METABOLIC PANEL
Calcium: 9 mg/dL (ref 8.4–10.5)
Chloride: 105 mEq/L (ref 96–112)
Creatinine, Ser: 1.2 mg/dL (ref 0.4–1.5)
Glucose, Bld: 86 mg/dL (ref 70–99)

## 2010-10-15 LAB — LIPID PANEL
HDL: 37 mg/dL — ABNORMAL LOW (ref >39.00)
LDL Cholesterol: 111 mg/dL — ABNORMAL HIGH (ref 0–99)
Total CHOL/HDL Ratio: 5
Triglycerides: 133 mg/dL (ref 0.0–149.0)
VLDL: 26.6 mg/dL (ref 0.0–40.0)

## 2010-10-16 ENCOUNTER — Ambulatory Visit (INDEPENDENT_AMBULATORY_CARE_PROVIDER_SITE_OTHER): Payer: BC Managed Care – PPO

## 2010-10-16 DIAGNOSIS — J301 Allergic rhinitis due to pollen: Secondary | ICD-10-CM

## 2010-10-16 NOTE — Miscellaneous (Signed)
Summary: Intradermal tests/[facility]  Intradermal tests/[facility]   Imported By: Lester Tustin 10/06/2010 10:47:26  _____________________________________________________________________  External Attachment:    Type:   Image     Comment:   External Document

## 2010-10-20 ENCOUNTER — Ambulatory Visit (INDEPENDENT_AMBULATORY_CARE_PROVIDER_SITE_OTHER): Payer: BC Managed Care – PPO | Admitting: Internal Medicine

## 2010-10-20 ENCOUNTER — Encounter: Payer: Self-pay | Admitting: Internal Medicine

## 2010-10-20 ENCOUNTER — Ambulatory Visit (INDEPENDENT_AMBULATORY_CARE_PROVIDER_SITE_OTHER): Payer: BC Managed Care – PPO

## 2010-10-20 DIAGNOSIS — G4700 Insomnia, unspecified: Secondary | ICD-10-CM

## 2010-10-20 DIAGNOSIS — J309 Allergic rhinitis, unspecified: Secondary | ICD-10-CM

## 2010-10-20 DIAGNOSIS — J301 Allergic rhinitis due to pollen: Secondary | ICD-10-CM

## 2010-10-20 DIAGNOSIS — E291 Testicular hypofunction: Secondary | ICD-10-CM

## 2010-10-22 NOTE — Miscellaneous (Signed)
Summary: [facility]   Imported By: Sherian Rein 10/15/2010 13:10:15  _____________________________________________________________________  External Attachment:    Type:   Image     Comment:   External Document

## 2010-10-23 ENCOUNTER — Ambulatory Visit (INDEPENDENT_AMBULATORY_CARE_PROVIDER_SITE_OTHER): Payer: BC Managed Care – PPO

## 2010-10-23 DIAGNOSIS — J301 Allergic rhinitis due to pollen: Secondary | ICD-10-CM

## 2010-10-27 ENCOUNTER — Encounter: Payer: Self-pay | Admitting: Internal Medicine

## 2010-10-27 ENCOUNTER — Ambulatory Visit (INDEPENDENT_AMBULATORY_CARE_PROVIDER_SITE_OTHER): Payer: BC Managed Care – PPO

## 2010-10-27 DIAGNOSIS — J301 Allergic rhinitis due to pollen: Secondary | ICD-10-CM | POA: Insufficient documentation

## 2010-10-28 NOTE — Assessment & Plan Note (Signed)
Summary: 2-3 MO ROV--LB   Vital Signs:  Patient profile:   45 year old male Height:      69 inches Weight:      194 pounds BMI:     28.75 Temp:     97.8 degrees F oral Pulse rate:   68 / minute Pulse rhythm:   regular Resp:     16 per minute BP sitting:   110 / 60  (left arm) Cuff size:   regular  Vitals Entered By: Lanier Prude, CMA(AAMA) (October 20, 2010 2:43 PM) CC: 2-3 mo f/u  Is Patient Diabetic? No Comments pt is not taking Singulair   Primary Care Provider:  Lonn Im  CC:  2-3 mo f/u .  History of Present Illness: The patient presents for a follow up of allergies and insomnia. F/u on low testost  Current Medications (verified): 1)  Sudafed 30 Mg Tabs (Pseudoephedrine Hcl) .Marland Kitchen.. 1-2 By Mouth Three Times A Day As Needed Congestion 2)  Singulair 10 Mg Tabs (Montelukast Sodium) .Marland Kitchen.. 1 By Mouth Qd 3)  Zyrtec Allergy 10 Mg Tabs (Cetirizine Hcl) .Marland Kitchen.. 1 By Mouth Qd 4)  Diphenhydramine Hcl 25 Mg Caps (Diphenhydramine Hcl) .Marland Kitchen.. 1 At At Bedtime As Needed 5)  Nasalcrom 5.2 Mg/act Aers (Cromolyn Sodium) .Marland Kitchen.. 1 Spray in Each Nostril Two Times A Day As Needed 6)  Fluticasone Propionate 50 Mcg/act Susp (Fluticasone Propionate) .Marland Kitchen.. 1-2 Puffs Each Nostril Once Daily At Bedtime 7)  Temazepam 15 Mg Caps (Temazepam) .Marland Kitchen.. 1-2 For Sleep If Needed  Allergies (verified): 1)  ! * Flonase  Past History:  Past Surgical History: Last updated: 07/18/2010 Cyst removal Testicular torsion L; s/p orchiectomy  Social History: Last updated: 09/02/2010 Occupation:mortgage co manager Single lives w/girlfriend (cats in the house) Alcohol use-yes Patient states former smoker. States he quit approx 2006.  Past Medical History: Allergic rhinitis H/o neck pain Hypogonadism 2011  Review of Systems  The patient denies fever, weight loss, weight gain, dyspnea on exertion, and abdominal pain.    Physical Exam  General:  Well-developed,well-nourished,in no acute distress;  alert,appropriate and cooperative throughout examination Ears:  External ear exam shows no significant lesions or deformities.  Otoscopic examination reveals clear canals, tympanic membranes are intact bilaterally without bulging, retraction, inflammation or discharge. Hearing is grossly normal bilaterally. Nose:  Swollen nasal mucosa B with clear d/c - better Mouth:  Oral mucosa and oropharynx without lesions or exudates.  Teeth in good repair. Neck:  No deformities, masses, or tenderness noted. Lungs:  Normal respiratory effort, chest expands symmetrically. Lungs are clear to auscultation, no crackles or wheezes. Heart:  Normal rate and regular rhythm. S1 and S2 normal without gallop, murmur, click, rub or other extra sounds. Abdomen:  Bowel sounds positive,abdomen soft and non-tender without masses, organomegaly or hernias noted. Msk:  No deformity or scoliosis noted of thoracic or lumbar spine.   Neurologic:  No cranial nerve deficits noted. Station and gait are normal. Plantar reflexes are down-going bilaterally. DTRs are symmetrical throughout. Sensory, motor and coordinative functions appear intact. Skin:  Intact without suspicious lesions or rashes Psych:  Cognition and judgment appear intact. Alert and cooperative with normal attention span and concentration. No apparent delusions, illusions, hallucinations   Impression & Recommendations:  Problem # 1:  ALLERGIC RHINITIS (ICD-477.9) Assessment Unchanged F/u w/Dr Maple Hudson -  allergy shots His updated medication list for this problem includes:    Sudafed 30 Mg Tabs (Pseudoephedrine hcl) .Marland Kitchen... 1-2 by mouth three times a day as needed congestion  Zyrtec Allergy 10 Mg Tabs (Cetirizine hcl) .Marland Kitchen... 1 by mouth qd    Diphenhydramine Hcl 25 Mg Caps (Diphenhydramine hcl) .Marland Kitchen... 1 at at bedtime as needed    Nasalcrom 5.2 Mg/act Aers (Cromolyn sodium) .Marland Kitchen... 1 spray in each nostril two times a day as needed    Fluticasone Propionate 50 Mcg/act Susp  (Fluticasone propionate) .Marland Kitchen... 1-2 puffs each nostril once daily at bedtime  Problem # 2:  INSOMNIA, CHRONIC (ICD-307.42) Assessment: Improved On the regimen of medicine(s) reflected in the chart    Problem # 3:  HYPOGONADISM (ICD-257.2) Assessment: New We discussed Rx options. he wants to have it rechecked after resuming healthy and active life style  Problem # 4:  FATIGUE (ICD-780.79) Assessment: Improved The labs were reviewed with the patient.   Complete Medication List: 1)  Sudafed 30 Mg Tabs (Pseudoephedrine hcl) .Marland Kitchen.. 1-2 by mouth three times a day as needed congestion 2)  Singulair 10 Mg Tabs (Montelukast sodium) .Marland Kitchen.. 1 by mouth qd 3)  Zyrtec Allergy 10 Mg Tabs (Cetirizine hcl) .Marland Kitchen.. 1 by mouth qd 4)  Diphenhydramine Hcl 25 Mg Caps (Diphenhydramine hcl) .Marland Kitchen.. 1 at at bedtime as needed 5)  Nasalcrom 5.2 Mg/act Aers (Cromolyn sodium) .Marland Kitchen.. 1 spray in each nostril two times a day as needed 6)  Fluticasone Propionate 50 Mcg/act Susp (Fluticasone propionate) .Marland Kitchen.. 1-2 puffs each nostril once daily at bedtime 7)  Temazepam 15 Mg Caps (Temazepam) .Marland Kitchen.. 1-2 for sleep if needed  Patient Instructions: 1)  Please schedule a follow-up appointment in 1 year well w/labs. 2)  Testosterone level in 1-2 months Dx 780.79 3)  Look up Androgel, Testim   Orders Added: 1)  Est. Patient Level IV [29528]

## 2010-10-30 ENCOUNTER — Ambulatory Visit (INDEPENDENT_AMBULATORY_CARE_PROVIDER_SITE_OTHER): Payer: BC Managed Care – PPO

## 2010-10-30 ENCOUNTER — Encounter: Payer: Self-pay | Admitting: Internal Medicine

## 2010-10-30 DIAGNOSIS — J301 Allergic rhinitis due to pollen: Secondary | ICD-10-CM

## 2010-11-03 ENCOUNTER — Ambulatory Visit (INDEPENDENT_AMBULATORY_CARE_PROVIDER_SITE_OTHER): Payer: BC Managed Care – PPO

## 2010-11-03 ENCOUNTER — Encounter: Payer: Self-pay | Admitting: Internal Medicine

## 2010-11-03 DIAGNOSIS — J301 Allergic rhinitis due to pollen: Secondary | ICD-10-CM

## 2010-11-06 ENCOUNTER — Ambulatory Visit (INDEPENDENT_AMBULATORY_CARE_PROVIDER_SITE_OTHER): Payer: BC Managed Care – PPO

## 2010-11-06 ENCOUNTER — Encounter: Payer: Self-pay | Admitting: Internal Medicine

## 2010-11-06 DIAGNOSIS — J301 Allergic rhinitis due to pollen: Secondary | ICD-10-CM

## 2010-11-06 NOTE — Assessment & Plan Note (Signed)
Summary: ALLERGY/CB  Nurse Visit   Allergies: 1)  ! * Flonase  Orders Added: 1)  Allergy Injection (1) [78295]

## 2010-11-06 NOTE — Miscellaneous (Signed)
Summary: Orders Update   Clinical Lists Changes  Problems: Added new problem of ALLERGIC RHINITIS DUE TO POLLEN (ICD-477.0) Orders: Added new Service order of Allergy Injection (1) (95115) - Signed 

## 2010-11-10 ENCOUNTER — Encounter: Payer: Self-pay | Admitting: Internal Medicine

## 2010-11-10 ENCOUNTER — Ambulatory Visit (INDEPENDENT_AMBULATORY_CARE_PROVIDER_SITE_OTHER): Payer: BC Managed Care – PPO

## 2010-11-10 DIAGNOSIS — J301 Allergic rhinitis due to pollen: Secondary | ICD-10-CM

## 2010-11-11 NOTE — Assessment & Plan Note (Signed)
Summary: ALLERGY/CB  Nurse Visit   Allergies: 1)  ! * Flonase  Orders Added: 1)  Allergy Injection (1) [95115] 

## 2010-11-13 ENCOUNTER — Encounter: Payer: Self-pay | Admitting: Internal Medicine

## 2010-11-13 ENCOUNTER — Ambulatory Visit (INDEPENDENT_AMBULATORY_CARE_PROVIDER_SITE_OTHER): Payer: BC Managed Care – PPO

## 2010-11-13 DIAGNOSIS — J301 Allergic rhinitis due to pollen: Secondary | ICD-10-CM

## 2010-11-17 ENCOUNTER — Encounter: Payer: Self-pay | Admitting: Internal Medicine

## 2010-11-17 ENCOUNTER — Ambulatory Visit (INDEPENDENT_AMBULATORY_CARE_PROVIDER_SITE_OTHER): Payer: BC Managed Care – PPO

## 2010-11-17 DIAGNOSIS — J301 Allergic rhinitis due to pollen: Secondary | ICD-10-CM

## 2010-11-18 ENCOUNTER — Ambulatory Visit (INDEPENDENT_AMBULATORY_CARE_PROVIDER_SITE_OTHER): Payer: BC Managed Care – PPO

## 2010-11-18 DIAGNOSIS — J301 Allergic rhinitis due to pollen: Secondary | ICD-10-CM

## 2010-11-18 NOTE — Assessment & Plan Note (Signed)
Summary: extract/cb  Nurse Visit   Allergies: 1)  ! * Flonase  Orders Added: 1)  [facility],1 or multi Secondary school teacher) [phone]

## 2010-11-18 NOTE — Assessment & Plan Note (Signed)
Summary: ALLERGY/CB  Nurse Visit   Allergies: 1)  ! * Flonase  Orders Added: 1)  Allergy Injection (1) [95115] 

## 2010-11-18 NOTE — Assessment & Plan Note (Signed)
Summary: allergy/cb  Nurse Visit   Allergies: 1)  ! * Flonase  Orders Added: 1)  Allergy Injection (1) [30160]

## 2010-11-20 ENCOUNTER — Ambulatory Visit (INDEPENDENT_AMBULATORY_CARE_PROVIDER_SITE_OTHER): Payer: BC Managed Care – PPO

## 2010-11-20 DIAGNOSIS — J301 Allergic rhinitis due to pollen: Secondary | ICD-10-CM

## 2010-11-24 ENCOUNTER — Ambulatory Visit (INDEPENDENT_AMBULATORY_CARE_PROVIDER_SITE_OTHER): Payer: BC Managed Care – PPO

## 2010-11-24 DIAGNOSIS — J301 Allergic rhinitis due to pollen: Secondary | ICD-10-CM

## 2010-11-27 ENCOUNTER — Ambulatory Visit (INDEPENDENT_AMBULATORY_CARE_PROVIDER_SITE_OTHER): Payer: BC Managed Care – PPO

## 2010-11-27 DIAGNOSIS — J301 Allergic rhinitis due to pollen: Secondary | ICD-10-CM

## 2010-11-27 NOTE — Assessment & Plan Note (Signed)
Summary: Allergy  Nurse Visit   Allergies: 1)  ! * Flonase  Orders Added: 1)  Allergy Injection (1) [84132]

## 2010-12-01 ENCOUNTER — Ambulatory Visit (INDEPENDENT_AMBULATORY_CARE_PROVIDER_SITE_OTHER): Payer: BC Managed Care – PPO | Admitting: Internal Medicine

## 2010-12-01 ENCOUNTER — Ambulatory Visit (INDEPENDENT_AMBULATORY_CARE_PROVIDER_SITE_OTHER): Payer: BC Managed Care – PPO

## 2010-12-01 ENCOUNTER — Encounter: Payer: Self-pay | Admitting: Internal Medicine

## 2010-12-01 DIAGNOSIS — J45909 Unspecified asthma, uncomplicated: Secondary | ICD-10-CM

## 2010-12-01 DIAGNOSIS — J45998 Other asthma: Secondary | ICD-10-CM | POA: Insufficient documentation

## 2010-12-01 DIAGNOSIS — F5104 Psychophysiologic insomnia: Secondary | ICD-10-CM

## 2010-12-01 DIAGNOSIS — G4700 Insomnia, unspecified: Secondary | ICD-10-CM

## 2010-12-01 DIAGNOSIS — J301 Allergic rhinitis due to pollen: Secondary | ICD-10-CM

## 2010-12-01 DIAGNOSIS — J309 Allergic rhinitis, unspecified: Secondary | ICD-10-CM

## 2010-12-01 DIAGNOSIS — J4520 Mild intermittent asthma, uncomplicated: Secondary | ICD-10-CM

## 2010-12-01 NOTE — Patient Instructions (Signed)
Continue building allergy vaccine. At our next office visit we will discuss a final increase.  OK to continue Singulair if it helps  Ok to use otc antihistamines  Ok to continue Temazepam as needed

## 2010-12-01 NOTE — Progress Notes (Signed)
  Subjective:    Patient ID: Jose Mahoney, male    DOB: 06-27-1966, 45 y.o.   MRN: [id number]  HPI 52 yoM former smoker,  followed for chronic insomnia, allergic rhinitis. Last here in October 01, 2010 for allergy testing and started on allergy vaccine. Now building here- at 1:500. We discussed the buildup process, concomittant antihistamines, and anaphyllaxis.  He tried singulair and found in retrospect he was breathing easier in his chest, especially with yard work or Educational psychologist exposure.  Temazepam 15 mg does help sleep, used 1-2x/ week. He feels it helps and is sufficient for his needs for now. Review of Systems See HPI Constitutional:   No weight loss, night sweats,  Fevers, chills, fatigue, lassitude. HEENT:   No headaches,  Difficulty swallowing,  Tooth/dental problems,  Sore throat,               Some- sneezing, itching, , nasal congestion, post nasal drip,   CV:  No chest pain,  Orthopnea, PND, swelling in lower extremities, anasarca, dizziness, palpitations  GI  No heartburn, indigestion, abdominal pain, nausea, vomiting, diarrhea, change in bowel habits, loss of appetite  Resp: Slight shortness of breath with exertion .  No excess mucus, no productive cough,  No non-productive cough,  No coughing up of blood.  No change in color of mucus.  No wheezing.  No chest wall deformity  Skin: no rash or lesions.  GU: no dysuria, change in color of urine, no urgency or frequency.  No flank pain.  MS:  No joint pain or swelling.  No decreased range of motion.  No back pain.  Psych:  No change in mood or affect. No depression or anxiety.  No memory loss.     Objective:   Physical Exam General- Alert, Oriented, Affect-appropriate, Distress- none acute    Not obese  Skin- rash-none, lesions- none, excoriation- none  Lymphadenopathy- none  Head- atraumatic  Eyes- Gross vision intact, PERRLA, conjunctivae clear, secretions  Ears- Normal- Hearing, canals, Tm L ,   R ,  Nose-  Slight stuffiness ,No-  Septal dev, mucus, polyps, erosion, perforation   Throat- Mallampati III , mucosa clear , drainage- none, tonsils- atrophic  Neck- flexible , trachea midline, no stridor , thyroid nl, carotid no bruit  Chest - symmetrical excursion , unlabored     Heart/CV- RRR , no murmur , no gallop  , no rub, nl s1 s2                     - JVD- none , edema- none, stasis changes- none, varices- none     Lung- clear to P&A, wheeze- none, cough- none , dullness-none, rub- none     Chest wall-   Abd- tender-no, distended-no, bowel sounds-present, HSM- no  Br/ Gen/ Rectal- Not done, not indicated  Extrem- cyanosis- none, clubbing, none, atrophy- none, strength- nl  Neuro- grossly intact to observation        Assessment & Plan:

## 2010-12-01 NOTE — Assessment & Plan Note (Addendum)
He is recognizing in retrospect that he has had intermnittent chest tightness and dyspsnea with very little wheeze or cough. Singulair has helped this to a useful degree, and has the cost efficiency benefit that it also helps his rhintis. I don't think he needs a rescue inhaler for now.

## 2010-12-01 NOTE — Assessment & Plan Note (Signed)
Doing well with occasional use of temazepam. He doesn't need dose adjustment or other intervention. Nasal congestion encourages sleep with mouth open and we have discussed snoring.

## 2010-12-01 NOTE — Assessment & Plan Note (Signed)
He is doing well so far with allergy vaccine build up and will continue. We have reviewwed safety/ rik benefit/ goals and symtomatic therapies.

## 2010-12-02 ENCOUNTER — Telehealth: Payer: Self-pay | Admitting: Internal Medicine

## 2010-12-02 NOTE — Telephone Encounter (Signed)
Called, spoke with pt. Pt states he still has the singular rx.  He has not yet taken it to the pharmacy.  Also, states it was written by Dr. Posey Rea.  Advised pt the first step to PA is he needs to take the rx to the pharmacy in order to initiate the PA.  He verbalized understanding of this and is aware PA will probably be sent to Dr. Loren Racer office bc he is the one who wrote the rx.

## 2010-12-02 NOTE — Telephone Encounter (Signed)
Will send message to Lawson Fiscal who is working on PA/rxs today.  Lori, pls advise if you have this PA.  Thank you!

## 2010-12-02 NOTE — Telephone Encounter (Signed)
PA request has not been received from the pharmacy. Pharmacy will need to fax over form.

## 2010-12-04 ENCOUNTER — Ambulatory Visit (INDEPENDENT_AMBULATORY_CARE_PROVIDER_SITE_OTHER): Payer: BC Managed Care – PPO

## 2010-12-04 DIAGNOSIS — J301 Allergic rhinitis due to pollen: Secondary | ICD-10-CM

## 2010-12-08 ENCOUNTER — Ambulatory Visit (INDEPENDENT_AMBULATORY_CARE_PROVIDER_SITE_OTHER): Payer: BC Managed Care – PPO

## 2010-12-08 DIAGNOSIS — J301 Allergic rhinitis due to pollen: Secondary | ICD-10-CM

## 2010-12-10 ENCOUNTER — Telehealth: Payer: Self-pay | Admitting: Internal Medicine

## 2010-12-10 NOTE — Telephone Encounter (Signed)
I advised pt we have not received a PA form for his singulair. I advised pt he needs to call his pharmacy and have them fax the form over so we can initiate the PA. Pt states he will call pharmacy and have them send it over

## 2010-12-11 ENCOUNTER — Ambulatory Visit (INDEPENDENT_AMBULATORY_CARE_PROVIDER_SITE_OTHER): Payer: BC Managed Care – PPO

## 2010-12-11 DIAGNOSIS — J309 Allergic rhinitis, unspecified: Secondary | ICD-10-CM

## 2010-12-15 ENCOUNTER — Ambulatory Visit (INDEPENDENT_AMBULATORY_CARE_PROVIDER_SITE_OTHER): Payer: BC Managed Care – PPO

## 2010-12-15 DIAGNOSIS — J309 Allergic rhinitis, unspecified: Secondary | ICD-10-CM

## 2010-12-16 ENCOUNTER — Telehealth: Payer: Self-pay | Admitting: Internal Medicine

## 2010-12-16 NOTE — Telephone Encounter (Signed)
PA requested for Singulair 10mg  PA form requested & received from [facility] @ [phone] 12/12/10. PA forwarded to MD for Completion 12/13/10 Completed PA form faxed for approval 12/15/10 Approval received & faxed to pharmacy.

## 2010-12-18 ENCOUNTER — Ambulatory Visit (INDEPENDENT_AMBULATORY_CARE_PROVIDER_SITE_OTHER): Payer: BC Managed Care – PPO

## 2010-12-18 DIAGNOSIS — J309 Allergic rhinitis, unspecified: Secondary | ICD-10-CM

## 2010-12-19 ENCOUNTER — Other Ambulatory Visit: Payer: Self-pay | Admitting: Internal Medicine

## 2010-12-19 ENCOUNTER — Other Ambulatory Visit (INDEPENDENT_AMBULATORY_CARE_PROVIDER_SITE_OTHER): Payer: BC Managed Care – PPO

## 2010-12-19 DIAGNOSIS — R5381 Other malaise: Secondary | ICD-10-CM

## 2010-12-19 DIAGNOSIS — R5383 Other fatigue: Secondary | ICD-10-CM

## 2010-12-19 LAB — TESTOSTERONE: Testosterone: 269.57 ng/dL — ABNORMAL LOW (ref 350.00–890.00)

## 2010-12-20 ENCOUNTER — Telehealth: Payer: Self-pay | Admitting: Internal Medicine

## 2010-12-20 NOTE — Telephone Encounter (Signed)
Misty Stanley , please, inform the patient: Jose Mahoney is better but still low  Please, keep  next office visit appointment.   Thank you !

## 2010-12-22 ENCOUNTER — Ambulatory Visit (INDEPENDENT_AMBULATORY_CARE_PROVIDER_SITE_OTHER): Payer: BC Managed Care – PPO

## 2010-12-22 DIAGNOSIS — J309 Allergic rhinitis, unspecified: Secondary | ICD-10-CM

## 2010-12-22 NOTE — Telephone Encounter (Signed)
Pt informed

## 2010-12-25 ENCOUNTER — Ambulatory Visit (INDEPENDENT_AMBULATORY_CARE_PROVIDER_SITE_OTHER): Payer: BC Managed Care – PPO

## 2010-12-25 DIAGNOSIS — J309 Allergic rhinitis, unspecified: Secondary | ICD-10-CM

## 2010-12-29 ENCOUNTER — Ambulatory Visit (INDEPENDENT_AMBULATORY_CARE_PROVIDER_SITE_OTHER): Payer: BC Managed Care – PPO

## 2010-12-29 DIAGNOSIS — J309 Allergic rhinitis, unspecified: Secondary | ICD-10-CM

## 2011-01-01 ENCOUNTER — Ambulatory Visit (INDEPENDENT_AMBULATORY_CARE_PROVIDER_SITE_OTHER): Payer: BC Managed Care – PPO

## 2011-01-01 DIAGNOSIS — J309 Allergic rhinitis, unspecified: Secondary | ICD-10-CM

## 2011-01-05 ENCOUNTER — Ambulatory Visit (INDEPENDENT_AMBULATORY_CARE_PROVIDER_SITE_OTHER): Payer: BC Managed Care – PPO

## 2011-01-05 DIAGNOSIS — J309 Allergic rhinitis, unspecified: Secondary | ICD-10-CM

## 2011-01-06 ENCOUNTER — Ambulatory Visit (INDEPENDENT_AMBULATORY_CARE_PROVIDER_SITE_OTHER): Payer: BC Managed Care – PPO

## 2011-01-06 DIAGNOSIS — J309 Allergic rhinitis, unspecified: Secondary | ICD-10-CM

## 2011-01-08 ENCOUNTER — Ambulatory Visit (INDEPENDENT_AMBULATORY_CARE_PROVIDER_SITE_OTHER): Payer: BC Managed Care – PPO

## 2011-01-08 DIAGNOSIS — J309 Allergic rhinitis, unspecified: Secondary | ICD-10-CM

## 2011-01-12 ENCOUNTER — Ambulatory Visit (INDEPENDENT_AMBULATORY_CARE_PROVIDER_SITE_OTHER): Payer: BC Managed Care – PPO

## 2011-01-12 DIAGNOSIS — J309 Allergic rhinitis, unspecified: Secondary | ICD-10-CM

## 2011-01-14 ENCOUNTER — Other Ambulatory Visit: Payer: Self-pay

## 2011-01-14 ENCOUNTER — Other Ambulatory Visit: Payer: Self-pay | Admitting: Internal Medicine

## 2011-01-14 DIAGNOSIS — E7800 Pure hypercholesterolemia, unspecified: Secondary | ICD-10-CM

## 2011-01-15 ENCOUNTER — Ambulatory Visit (INDEPENDENT_AMBULATORY_CARE_PROVIDER_SITE_OTHER): Payer: BC Managed Care – PPO

## 2011-01-15 DIAGNOSIS — J309 Allergic rhinitis, unspecified: Secondary | ICD-10-CM

## 2011-01-19 ENCOUNTER — Ambulatory Visit (INDEPENDENT_AMBULATORY_CARE_PROVIDER_SITE_OTHER): Payer: BC Managed Care – PPO

## 2011-01-19 DIAGNOSIS — J309 Allergic rhinitis, unspecified: Secondary | ICD-10-CM

## 2011-01-21 ENCOUNTER — Ambulatory Visit: Payer: Self-pay | Admitting: Internal Medicine

## 2011-01-22 ENCOUNTER — Ambulatory Visit (INDEPENDENT_AMBULATORY_CARE_PROVIDER_SITE_OTHER): Payer: BC Managed Care – PPO

## 2011-01-22 DIAGNOSIS — J309 Allergic rhinitis, unspecified: Secondary | ICD-10-CM

## 2011-01-27 ENCOUNTER — Ambulatory Visit (INDEPENDENT_AMBULATORY_CARE_PROVIDER_SITE_OTHER): Payer: BC Managed Care – PPO

## 2011-01-27 DIAGNOSIS — J309 Allergic rhinitis, unspecified: Secondary | ICD-10-CM

## 2011-02-02 ENCOUNTER — Ambulatory Visit (INDEPENDENT_AMBULATORY_CARE_PROVIDER_SITE_OTHER): Payer: BC Managed Care – PPO

## 2011-02-02 ENCOUNTER — Encounter: Payer: Self-pay | Admitting: Internal Medicine

## 2011-02-02 DIAGNOSIS — J309 Allergic rhinitis, unspecified: Secondary | ICD-10-CM

## 2011-02-05 ENCOUNTER — Ambulatory Visit (INDEPENDENT_AMBULATORY_CARE_PROVIDER_SITE_OTHER): Payer: BC Managed Care – PPO

## 2011-02-05 DIAGNOSIS — J309 Allergic rhinitis, unspecified: Secondary | ICD-10-CM

## 2011-02-09 ENCOUNTER — Ambulatory Visit (INDEPENDENT_AMBULATORY_CARE_PROVIDER_SITE_OTHER): Payer: BC Managed Care – PPO

## 2011-02-09 DIAGNOSIS — J309 Allergic rhinitis, unspecified: Secondary | ICD-10-CM

## 2011-02-12 ENCOUNTER — Ambulatory Visit (INDEPENDENT_AMBULATORY_CARE_PROVIDER_SITE_OTHER): Payer: BC Managed Care – PPO

## 2011-02-12 DIAGNOSIS — J309 Allergic rhinitis, unspecified: Secondary | ICD-10-CM

## 2011-02-16 ENCOUNTER — Ambulatory Visit (INDEPENDENT_AMBULATORY_CARE_PROVIDER_SITE_OTHER): Payer: BC Managed Care – PPO

## 2011-02-16 DIAGNOSIS — J309 Allergic rhinitis, unspecified: Secondary | ICD-10-CM

## 2011-02-19 ENCOUNTER — Ambulatory Visit (INDEPENDENT_AMBULATORY_CARE_PROVIDER_SITE_OTHER): Payer: BC Managed Care – PPO

## 2011-02-19 DIAGNOSIS — J309 Allergic rhinitis, unspecified: Secondary | ICD-10-CM

## 2011-02-23 ENCOUNTER — Ambulatory Visit (INDEPENDENT_AMBULATORY_CARE_PROVIDER_SITE_OTHER): Payer: BC Managed Care – PPO

## 2011-02-23 DIAGNOSIS — J309 Allergic rhinitis, unspecified: Secondary | ICD-10-CM

## 2011-02-26 ENCOUNTER — Ambulatory Visit (INDEPENDENT_AMBULATORY_CARE_PROVIDER_SITE_OTHER): Payer: BC Managed Care – PPO

## 2011-02-26 DIAGNOSIS — J309 Allergic rhinitis, unspecified: Secondary | ICD-10-CM

## 2011-03-02 ENCOUNTER — Ambulatory Visit (INDEPENDENT_AMBULATORY_CARE_PROVIDER_SITE_OTHER): Payer: BC Managed Care – PPO

## 2011-03-02 DIAGNOSIS — J309 Allergic rhinitis, unspecified: Secondary | ICD-10-CM

## 2011-03-05 ENCOUNTER — Ambulatory Visit (INDEPENDENT_AMBULATORY_CARE_PROVIDER_SITE_OTHER): Payer: BC Managed Care – PPO

## 2011-03-05 DIAGNOSIS — J309 Allergic rhinitis, unspecified: Secondary | ICD-10-CM

## 2011-03-09 ENCOUNTER — Ambulatory Visit (INDEPENDENT_AMBULATORY_CARE_PROVIDER_SITE_OTHER): Payer: BC Managed Care – PPO

## 2011-03-09 ENCOUNTER — Encounter: Payer: Self-pay | Admitting: Internal Medicine

## 2011-03-09 DIAGNOSIS — J309 Allergic rhinitis, unspecified: Secondary | ICD-10-CM

## 2011-03-12 ENCOUNTER — Ambulatory Visit (INDEPENDENT_AMBULATORY_CARE_PROVIDER_SITE_OTHER): Payer: BC Managed Care – PPO

## 2011-03-12 DIAGNOSIS — J309 Allergic rhinitis, unspecified: Secondary | ICD-10-CM

## 2011-03-16 ENCOUNTER — Ambulatory Visit (INDEPENDENT_AMBULATORY_CARE_PROVIDER_SITE_OTHER): Payer: BC Managed Care – PPO

## 2011-03-16 DIAGNOSIS — J309 Allergic rhinitis, unspecified: Secondary | ICD-10-CM

## 2011-03-20 ENCOUNTER — Ambulatory Visit (INDEPENDENT_AMBULATORY_CARE_PROVIDER_SITE_OTHER): Payer: BC Managed Care – PPO

## 2011-03-20 DIAGNOSIS — J309 Allergic rhinitis, unspecified: Secondary | ICD-10-CM

## 2011-03-23 ENCOUNTER — Telehealth: Payer: Self-pay | Admitting: *Deleted

## 2011-03-23 ENCOUNTER — Ambulatory Visit (INDEPENDENT_AMBULATORY_CARE_PROVIDER_SITE_OTHER): Payer: BC Managed Care – PPO

## 2011-03-23 DIAGNOSIS — J309 Allergic rhinitis, unspecified: Secondary | ICD-10-CM

## 2011-03-23 MED ORDER — EPINEPHRINE 0.3 MG/0.3ML IJ DEVI: 0.3000 mg | Freq: Once | INTRAMUSCULAR | Status: DC | PRN

## 2011-03-23 NOTE — Telephone Encounter (Signed)
I instructed patient on use of Epipen and when to use it. Also, pt has the number to the office to call in the case of an reaction. Pt verbally stated he understood to go to ER if reaction was severe or concerning to him. Then he could contact our office afterwards for further instructions(not to take vaccine until seen by CY).    Rx Sent.

## 2011-03-26 ENCOUNTER — Ambulatory Visit (INDEPENDENT_AMBULATORY_CARE_PROVIDER_SITE_OTHER): Payer: BC Managed Care – PPO

## 2011-03-26 DIAGNOSIS — J309 Allergic rhinitis, unspecified: Secondary | ICD-10-CM

## 2011-03-30 ENCOUNTER — Ambulatory Visit (INDEPENDENT_AMBULATORY_CARE_PROVIDER_SITE_OTHER): Payer: BC Managed Care – PPO

## 2011-03-30 DIAGNOSIS — J309 Allergic rhinitis, unspecified: Secondary | ICD-10-CM

## 2011-04-02 ENCOUNTER — Ambulatory Visit (INDEPENDENT_AMBULATORY_CARE_PROVIDER_SITE_OTHER): Payer: BC Managed Care – PPO | Admitting: Internal Medicine

## 2011-04-02 ENCOUNTER — Encounter: Payer: Self-pay | Admitting: Internal Medicine

## 2011-04-02 ENCOUNTER — Ambulatory Visit (INDEPENDENT_AMBULATORY_CARE_PROVIDER_SITE_OTHER): Payer: BC Managed Care – PPO

## 2011-04-02 VITALS — BP 104/62 | HR 74 | Ht 69.0 in | Wt 200.0 lb

## 2011-04-02 DIAGNOSIS — G4700 Insomnia, unspecified: Secondary | ICD-10-CM

## 2011-04-02 DIAGNOSIS — J309 Allergic rhinitis, unspecified: Secondary | ICD-10-CM

## 2011-04-02 MED ORDER — TEMAZEPAM 15 MG PO CAPS: 15.0000 mg | ORAL_CAPSULE | Freq: Every evening | ORAL | Status: DC | PRN

## 2011-04-02 NOTE — Patient Instructions (Addendum)
We can change your allergy vaccine schedule to weekly now  Try sample maintenance nasal steroid Qnasl     1 spray each nostril every night at bedtime Try sample Astepro nasal antihistamine -  1-2 sprays each nostril up to twice daily as needed     Temazepam refill sent to pharmacy

## 2011-04-02 NOTE — Assessment & Plan Note (Signed)
Doing better but still with room to improve. Plan- ok to get allergy shots once weekly now          Try sample Qnasl           Try sample Astepro

## 2011-04-02 NOTE — Progress Notes (Signed)
Subjective:    Patient ID: Jose Mahoney, male    DOB: 22-Feb-1966, 45 y.o.   MRN: [id number]  HPI    Review of Systems     Objective:   Physical Exam        Assessment & Plan:   Subjective:    Patient ID: Jose Mahoney, male    DOB: 01-23-1966, 45 y.o.   MRN: [id number]  HPI 56 yoM former smoker,  followed for chronic insomnia, allergic rhinitis. Last here in October 01, 2010 for allergy testing and started on allergy vaccine. Now building here- at 1:500. We discussed the buildup process, concomittant antihistamines, and anaphyllaxis.  He tried singulair and found in retrospect he was breathing easier in his chest, especially with yard work or Educational psychologist exposure.  Temazepam 15 mg does help sleep, used 1-2x/ week. He feels it helps and is sufficient for his needs for now.  04/02/11- [name] former smoker,  followed for chronic insomnia, allergic rhinitis. Slowly building allergy vaccine now at 1:50, has had some local reactions. He reports fairly smooth Spring season this year- no significant problems. Holding at 0.3 of 1:50. Had continued twice weekly. Still taking Zyrtec.  Temazepam works well for intermittent chronic insomnia, used 2-3 x/ week.  Vaccine injection record reviewed.  Review of Systems See HPI Constitutional:   No weight loss, night sweats,  Fevers, chills, fatigue, lassitude. HEENT:   No headaches,  Difficulty swallowing,  Tooth/dental problems,  Sore throat,               Some- sneezing, itching, , nasal congestion, post nasal drip,   CV:  No chest pain,  Orthopnea, PND, swelling in lower extremities, anasarca, dizziness, palpitations  GI  No heartburn, indigestion, abdominal pain, nausea, vomiting, diarrhea, change in bowel habits, loss of appetite  Resp: Slight shortness of breath with exertion .  No excess mucus, no productive cough,  No non-productive cough,  No coughing up of blood.  No change in color of mucus.  No wheezing.  No chest wall  deformity  Skin: no rash or lesions.  GU: no dysuria, change in color of urine, no urgency or frequency.  No flank pain.  MS:  No joint pain or swelling.  No decreased range of motion.  No back pain.  Psych:  No change in mood or affect. No depression or anxiety.  No memory loss.     Objective:   Physical Exam General- Alert, Oriented, Affect-appropriate, Distress- none acute Skin- rash-none, lesions- none, excoriation- none Lymphadenopathy- none Head- atraumatic            Eyes- Gross vision intact, PERRLA, conjunctivae clear secretions            Ears- Hearing, canals            Nose- Clear, No-Septal dev, mucus, polyps, erosion, perforation             Throat- Mallampati III , mucosa clear , drainage- none, tonsils- atrophic Neck- flexible , trachea midline, no stridor , thyroid nl, carotid no bruit Chest - symmetrical excursion , unlabored           Heart/CV- RRR , no murmur , no gallop  , no rub, nl s1 s2                           - JVD- none , edema- none, stasis changes- none, varices- none  Lung- clear to P&A, wheeze- none, cough- none , dullness-none, rub- none           Chest wall-  Abd- tender-no, distended-no, bowel sounds-present, HSM- no Br/ Gen/ Rectal- Not done, not indicated Extrem- cyanosis- none, clubbing, none, atrophy- none, strength- nl Neuro- grossly intact to observation        Assessment & Plan:

## 2011-04-05 NOTE — Assessment & Plan Note (Signed)
He is working with occasional temazepam

## 2011-04-08 ENCOUNTER — Ambulatory Visit (INDEPENDENT_AMBULATORY_CARE_PROVIDER_SITE_OTHER): Payer: BC Managed Care – PPO

## 2011-04-08 DIAGNOSIS — J309 Allergic rhinitis, unspecified: Secondary | ICD-10-CM

## 2011-04-14 ENCOUNTER — Ambulatory Visit (INDEPENDENT_AMBULATORY_CARE_PROVIDER_SITE_OTHER): Payer: BC Managed Care – PPO

## 2011-04-14 DIAGNOSIS — J309 Allergic rhinitis, unspecified: Secondary | ICD-10-CM

## 2011-04-21 ENCOUNTER — Ambulatory Visit (INDEPENDENT_AMBULATORY_CARE_PROVIDER_SITE_OTHER): Payer: BC Managed Care – PPO

## 2011-04-21 DIAGNOSIS — J309 Allergic rhinitis, unspecified: Secondary | ICD-10-CM

## 2011-04-28 ENCOUNTER — Ambulatory Visit (INDEPENDENT_AMBULATORY_CARE_PROVIDER_SITE_OTHER): Payer: BC Managed Care – PPO

## 2011-04-28 DIAGNOSIS — J309 Allergic rhinitis, unspecified: Secondary | ICD-10-CM

## 2011-05-01 ENCOUNTER — Encounter: Payer: Self-pay | Admitting: Internal Medicine

## 2011-05-05 ENCOUNTER — Ambulatory Visit (INDEPENDENT_AMBULATORY_CARE_PROVIDER_SITE_OTHER): Payer: BC Managed Care – PPO

## 2011-05-05 DIAGNOSIS — J309 Allergic rhinitis, unspecified: Secondary | ICD-10-CM

## 2011-05-12 ENCOUNTER — Ambulatory Visit (INDEPENDENT_AMBULATORY_CARE_PROVIDER_SITE_OTHER): Payer: BC Managed Care – PPO

## 2011-05-12 DIAGNOSIS — J309 Allergic rhinitis, unspecified: Secondary | ICD-10-CM

## 2011-05-19 ENCOUNTER — Ambulatory Visit (INDEPENDENT_AMBULATORY_CARE_PROVIDER_SITE_OTHER): Payer: BC Managed Care – PPO

## 2011-05-19 DIAGNOSIS — J309 Allergic rhinitis, unspecified: Secondary | ICD-10-CM

## 2011-05-26 ENCOUNTER — Ambulatory Visit (INDEPENDENT_AMBULATORY_CARE_PROVIDER_SITE_OTHER): Payer: BC Managed Care – PPO

## 2011-05-26 DIAGNOSIS — J309 Allergic rhinitis, unspecified: Secondary | ICD-10-CM

## 2011-06-02 ENCOUNTER — Ambulatory Visit (INDEPENDENT_AMBULATORY_CARE_PROVIDER_SITE_OTHER): Payer: BC Managed Care – PPO

## 2011-06-02 DIAGNOSIS — J309 Allergic rhinitis, unspecified: Secondary | ICD-10-CM

## 2011-06-08 ENCOUNTER — Ambulatory Visit (INDEPENDENT_AMBULATORY_CARE_PROVIDER_SITE_OTHER): Payer: BC Managed Care – PPO

## 2011-06-08 DIAGNOSIS — J309 Allergic rhinitis, unspecified: Secondary | ICD-10-CM

## 2011-06-11 ENCOUNTER — Ambulatory Visit (INDEPENDENT_AMBULATORY_CARE_PROVIDER_SITE_OTHER): Payer: BC Managed Care – PPO

## 2011-06-11 DIAGNOSIS — Z23 Encounter for immunization: Secondary | ICD-10-CM

## 2011-06-11 DIAGNOSIS — J309 Allergic rhinitis, unspecified: Secondary | ICD-10-CM

## 2011-06-16 ENCOUNTER — Ambulatory Visit (INDEPENDENT_AMBULATORY_CARE_PROVIDER_SITE_OTHER): Payer: BC Managed Care – PPO

## 2011-06-16 DIAGNOSIS — J309 Allergic rhinitis, unspecified: Secondary | ICD-10-CM

## 2011-06-22 ENCOUNTER — Ambulatory Visit (INDEPENDENT_AMBULATORY_CARE_PROVIDER_SITE_OTHER): Payer: BC Managed Care – PPO

## 2011-06-22 DIAGNOSIS — J309 Allergic rhinitis, unspecified: Secondary | ICD-10-CM

## 2011-06-30 ENCOUNTER — Ambulatory Visit (INDEPENDENT_AMBULATORY_CARE_PROVIDER_SITE_OTHER): Payer: BC Managed Care – PPO

## 2011-06-30 DIAGNOSIS — J309 Allergic rhinitis, unspecified: Secondary | ICD-10-CM

## 2011-07-06 ENCOUNTER — Ambulatory Visit (INDEPENDENT_AMBULATORY_CARE_PROVIDER_SITE_OTHER): Payer: BC Managed Care – PPO

## 2011-07-06 DIAGNOSIS — J309 Allergic rhinitis, unspecified: Secondary | ICD-10-CM

## 2011-07-14 ENCOUNTER — Ambulatory Visit (INDEPENDENT_AMBULATORY_CARE_PROVIDER_SITE_OTHER): Payer: BC Managed Care – PPO

## 2011-07-14 DIAGNOSIS — J309 Allergic rhinitis, unspecified: Secondary | ICD-10-CM

## 2011-07-21 ENCOUNTER — Ambulatory Visit (INDEPENDENT_AMBULATORY_CARE_PROVIDER_SITE_OTHER): Payer: BC Managed Care – PPO

## 2011-07-21 DIAGNOSIS — J309 Allergic rhinitis, unspecified: Secondary | ICD-10-CM

## 2011-07-27 ENCOUNTER — Ambulatory Visit (INDEPENDENT_AMBULATORY_CARE_PROVIDER_SITE_OTHER): Payer: BC Managed Care – PPO

## 2011-07-27 DIAGNOSIS — J309 Allergic rhinitis, unspecified: Secondary | ICD-10-CM

## 2011-08-03 ENCOUNTER — Ambulatory Visit (INDEPENDENT_AMBULATORY_CARE_PROVIDER_SITE_OTHER): Payer: BC Managed Care – PPO

## 2011-08-03 ENCOUNTER — Encounter: Payer: Self-pay | Admitting: Internal Medicine

## 2011-08-03 DIAGNOSIS — J309 Allergic rhinitis, unspecified: Secondary | ICD-10-CM

## 2011-08-10 ENCOUNTER — Ambulatory Visit (INDEPENDENT_AMBULATORY_CARE_PROVIDER_SITE_OTHER): Payer: BC Managed Care – PPO

## 2011-08-10 ENCOUNTER — Telehealth: Payer: Self-pay | Admitting: Internal Medicine

## 2011-08-10 DIAGNOSIS — J309 Allergic rhinitis, unspecified: Secondary | ICD-10-CM

## 2011-08-10 MED ORDER — MONTELUKAST SODIUM 10 MG PO TABS: 10.0000 mg | ORAL_TABLET | Freq: Every day | ORAL | Status: DC

## 2011-08-10 NOTE — Telephone Encounter (Signed)
Pt is aware of Rx has been sent to pharmacy.

## 2011-08-17 ENCOUNTER — Ambulatory Visit (INDEPENDENT_AMBULATORY_CARE_PROVIDER_SITE_OTHER): Payer: BC Managed Care – PPO

## 2011-08-17 DIAGNOSIS — J309 Allergic rhinitis, unspecified: Secondary | ICD-10-CM

## 2011-08-24 ENCOUNTER — Ambulatory Visit (INDEPENDENT_AMBULATORY_CARE_PROVIDER_SITE_OTHER): Payer: BC Managed Care – PPO

## 2011-08-24 DIAGNOSIS — J309 Allergic rhinitis, unspecified: Secondary | ICD-10-CM

## 2011-08-31 ENCOUNTER — Ambulatory Visit (INDEPENDENT_AMBULATORY_CARE_PROVIDER_SITE_OTHER): Payer: BC Managed Care – PPO

## 2011-08-31 DIAGNOSIS — J309 Allergic rhinitis, unspecified: Secondary | ICD-10-CM

## 2011-09-07 ENCOUNTER — Ambulatory Visit (INDEPENDENT_AMBULATORY_CARE_PROVIDER_SITE_OTHER): Payer: BC Managed Care – PPO

## 2011-09-07 DIAGNOSIS — J309 Allergic rhinitis, unspecified: Secondary | ICD-10-CM

## 2011-09-15 ENCOUNTER — Ambulatory Visit (INDEPENDENT_AMBULATORY_CARE_PROVIDER_SITE_OTHER): Payer: BC Managed Care – PPO

## 2011-09-15 DIAGNOSIS — J309 Allergic rhinitis, unspecified: Secondary | ICD-10-CM

## 2011-09-22 ENCOUNTER — Ambulatory Visit (INDEPENDENT_AMBULATORY_CARE_PROVIDER_SITE_OTHER): Payer: BC Managed Care – PPO

## 2011-09-22 DIAGNOSIS — J309 Allergic rhinitis, unspecified: Secondary | ICD-10-CM

## 2011-09-28 ENCOUNTER — Ambulatory Visit (INDEPENDENT_AMBULATORY_CARE_PROVIDER_SITE_OTHER): Payer: BC Managed Care – PPO

## 2011-09-28 DIAGNOSIS — J309 Allergic rhinitis, unspecified: Secondary | ICD-10-CM

## 2011-10-07 ENCOUNTER — Encounter: Payer: Self-pay | Admitting: Internal Medicine

## 2011-10-07 ENCOUNTER — Ambulatory Visit (INDEPENDENT_AMBULATORY_CARE_PROVIDER_SITE_OTHER): Payer: BC Managed Care – PPO | Admitting: Internal Medicine

## 2011-10-07 VITALS — BP 110/72 | HR 61 | Ht 69.0 in | Wt 196.4 lb

## 2011-10-07 DIAGNOSIS — J029 Acute pharyngitis, unspecified: Secondary | ICD-10-CM

## 2011-10-07 DIAGNOSIS — G4700 Insomnia, unspecified: Secondary | ICD-10-CM

## 2011-10-07 DIAGNOSIS — B349 Viral infection, unspecified: Secondary | ICD-10-CM

## 2011-10-07 DIAGNOSIS — B9789 Other viral agents as the cause of diseases classified elsewhere: Secondary | ICD-10-CM

## 2011-10-07 DIAGNOSIS — J309 Allergic rhinitis, unspecified: Secondary | ICD-10-CM

## 2011-10-07 MED ORDER — MONTELUKAST SODIUM 10 MG PO TABS: 10.0000 mg | ORAL_TABLET | Freq: Every day | ORAL | Status: DC

## 2011-10-07 NOTE — Patient Instructions (Signed)
Symptomatic treatment for the sore throat/ cold- extra fluids, throat lozenges, sudafed or combinations if they are helpful.  Please call if needed.  Refill script Singulair  I will have the allergy lab increase your vaccine one more concentration step next time you re-order.

## 2011-10-07 NOTE — Progress Notes (Signed)
Patient ID: Jose Mahoney, male    DOB: [birth year], 46 y.o.   MRN: [id number]  HPI 25 yoM former smoker,  followed for chronic insomnia, allergic rhinitis. Last here in October 01, 2010 for allergy testing and started on allergy vaccine. Now building here- at 1:500. We discussed the buildup process, concomittant antihistamines, and anaphyllaxis.  He tried singulair and found in retrospect he was breathing easier in his chest, especially with yard work or Educational psychologist exposure.  Temazepam 15 mg does help sleep, used 1-2x/ week. He feels it helps and is sufficient for his needs for now.  04/02/11- [name] former smoker,  followed for chronic insomnia, allergic rhinitis. Slowly building allergy vaccine now at 1:50, has had some local reactions. He reports fairly smooth Spring season this year- no significant problems. Holding at 0.3 of 1:50. Had continued twice weekly. Still taking Zyrtec.  Temazepam works well for intermittent chronic insomnia, used 2-3 x/ week.   10/07/11- [name] former smoker,  followed for chronic insomnia, allergic rhinitis, here in October 01, 2010 for allergy testing and started on allergy vaccine. Vaccine injection record reviewed. Acute visit complaining of recent onset sore throat with postnasal drainage some cough with light yellow sputum, no fever. Did get flu vaccine. Has been getting allergy vaccine without problems. We discussed pending spring pollen season but agreed the current illness has been viral.   ROS-see HPI Constitutional:   No-   weight loss, night sweats, fevers, chills, fatigue, lassitude. HEENT:   No-  headaches, difficulty swallowing, tooth/dental problems,   +sore throat,       No-  sneezing, itching, ear ache,  +nasal congestion,  +post nasal drip,  CV:  No-   chest pain, orthopnea, PND, swelling in lower extremities, anasarca, dizziness, palpitations Resp: No-   shortness of breath with exertion or at rest.              No-   productive cough,  No  non-productive cough,  No- coughing up of blood.              No-   change in color of mucus.  No- wheezing.   Skin: No-   rash or lesions. GI:  No-   heartburn, indigestion, abdominal pain, nausea, vomiting, diarrhea,                 change in bowel habits, loss of appetite GU:  MS:  No-   joint pain or swelling.  No- decreased range of motion.  No- back pain. Neuro-     nothing unusual Psych:  No- change in mood or affect. No depression or anxiety.  No memory loss.      Objective:   Physical Exam General- Alert, Oriented, Affect-appropriate, Distress- none acute, fit-appearing Skin- rash-none, lesions- none, excoriation- none Lymphadenopathy- none Head- atraumatic            Eyes- Gross vision intact, PERRLA, conjunctivae clear secretions            Ears- Hearing, canals            Nose- Clear, No-Septal dev, mucus, polyps, erosion, perforation             Throat- Mallampati III , mucosa red , drainage- none, tonsils- atrophic Neck- flexible , trachea midline, no stridor , thyroid nl, carotid no bruit Chest - symmetrical excursion , unlabored           Heart/CV- RRR , no murmur , no gallop  ,  no rub, nl s1 s2                           - JVD- none , edema- none, stasis changes- none, varices- none           Lung- clear to P&A, wheeze- none, cough- none , dullness-none, rub- none           Chest wall-  Abd-  Br/ Gen/ Rectal- Not done, not indicated Extrem- cyanosis- none, clubbing, none, atrophy- none, strength- nl Neuro- grossly intact to observation

## 2011-10-08 ENCOUNTER — Ambulatory Visit: Payer: BC Managed Care – PPO | Admitting: Internal Medicine

## 2011-10-10 DIAGNOSIS — B349 Viral infection, unspecified: Secondary | ICD-10-CM | POA: Insufficient documentation

## 2011-10-10 NOTE — Assessment & Plan Note (Signed)
Discussed symptomatic therapy. This is not a strep throat and there is no place for antibiotics yet unless he develops a sinusitis or purulent bronchitis. He is already beginning to recognize improvement.

## 2011-10-10 NOTE — Assessment & Plan Note (Signed)
We can advance to 1:10 with next vaccine order as discussed.

## 2011-10-10 NOTE — Assessment & Plan Note (Signed)
Had been doing better until he caught the cold. No new intervention offered.

## 2011-10-12 ENCOUNTER — Ambulatory Visit (INDEPENDENT_AMBULATORY_CARE_PROVIDER_SITE_OTHER): Payer: BC Managed Care – PPO

## 2011-10-12 DIAGNOSIS — J309 Allergic rhinitis, unspecified: Secondary | ICD-10-CM

## 2011-10-20 ENCOUNTER — Ambulatory Visit (INDEPENDENT_AMBULATORY_CARE_PROVIDER_SITE_OTHER): Payer: BC Managed Care – PPO

## 2011-10-20 DIAGNOSIS — J309 Allergic rhinitis, unspecified: Secondary | ICD-10-CM

## 2011-10-26 ENCOUNTER — Ambulatory Visit (INDEPENDENT_AMBULATORY_CARE_PROVIDER_SITE_OTHER): Payer: BC Managed Care – PPO

## 2011-10-26 DIAGNOSIS — J309 Allergic rhinitis, unspecified: Secondary | ICD-10-CM

## 2011-11-02 ENCOUNTER — Ambulatory Visit (INDEPENDENT_AMBULATORY_CARE_PROVIDER_SITE_OTHER): Payer: BC Managed Care – PPO

## 2011-11-02 DIAGNOSIS — J309 Allergic rhinitis, unspecified: Secondary | ICD-10-CM

## 2011-11-10 ENCOUNTER — Ambulatory Visit (INDEPENDENT_AMBULATORY_CARE_PROVIDER_SITE_OTHER): Payer: BC Managed Care – PPO

## 2011-11-10 DIAGNOSIS — J309 Allergic rhinitis, unspecified: Secondary | ICD-10-CM

## 2011-11-16 ENCOUNTER — Ambulatory Visit (INDEPENDENT_AMBULATORY_CARE_PROVIDER_SITE_OTHER): Payer: BC Managed Care – PPO

## 2011-11-16 DIAGNOSIS — J309 Allergic rhinitis, unspecified: Secondary | ICD-10-CM

## 2011-11-17 ENCOUNTER — Encounter: Payer: Self-pay | Admitting: Internal Medicine

## 2011-11-23 ENCOUNTER — Ambulatory Visit (INDEPENDENT_AMBULATORY_CARE_PROVIDER_SITE_OTHER): Payer: BC Managed Care – PPO

## 2011-11-23 DIAGNOSIS — J309 Allergic rhinitis, unspecified: Secondary | ICD-10-CM

## 2011-11-30 ENCOUNTER — Ambulatory Visit (INDEPENDENT_AMBULATORY_CARE_PROVIDER_SITE_OTHER): Payer: BC Managed Care – PPO

## 2011-11-30 DIAGNOSIS — J309 Allergic rhinitis, unspecified: Secondary | ICD-10-CM

## 2011-12-08 ENCOUNTER — Ambulatory Visit (INDEPENDENT_AMBULATORY_CARE_PROVIDER_SITE_OTHER): Payer: BC Managed Care – PPO

## 2011-12-08 DIAGNOSIS — J309 Allergic rhinitis, unspecified: Secondary | ICD-10-CM

## 2011-12-15 ENCOUNTER — Ambulatory Visit (INDEPENDENT_AMBULATORY_CARE_PROVIDER_SITE_OTHER): Payer: BC Managed Care – PPO

## 2011-12-15 DIAGNOSIS — J309 Allergic rhinitis, unspecified: Secondary | ICD-10-CM

## 2011-12-22 ENCOUNTER — Ambulatory Visit (INDEPENDENT_AMBULATORY_CARE_PROVIDER_SITE_OTHER): Payer: BC Managed Care – PPO

## 2011-12-22 DIAGNOSIS — J309 Allergic rhinitis, unspecified: Secondary | ICD-10-CM

## 2011-12-29 ENCOUNTER — Ambulatory Visit (INDEPENDENT_AMBULATORY_CARE_PROVIDER_SITE_OTHER): Payer: BC Managed Care – PPO

## 2011-12-29 DIAGNOSIS — J309 Allergic rhinitis, unspecified: Secondary | ICD-10-CM

## 2011-12-30 ENCOUNTER — Ambulatory Visit (INDEPENDENT_AMBULATORY_CARE_PROVIDER_SITE_OTHER): Payer: BC Managed Care – PPO

## 2011-12-30 DIAGNOSIS — J309 Allergic rhinitis, unspecified: Secondary | ICD-10-CM

## 2012-01-04 ENCOUNTER — Ambulatory Visit (INDEPENDENT_AMBULATORY_CARE_PROVIDER_SITE_OTHER): Payer: BC Managed Care – PPO

## 2012-01-04 DIAGNOSIS — J309 Allergic rhinitis, unspecified: Secondary | ICD-10-CM

## 2012-01-12 ENCOUNTER — Ambulatory Visit (INDEPENDENT_AMBULATORY_CARE_PROVIDER_SITE_OTHER): Payer: BC Managed Care – PPO

## 2012-01-12 DIAGNOSIS — J309 Allergic rhinitis, unspecified: Secondary | ICD-10-CM

## 2012-01-18 ENCOUNTER — Ambulatory Visit (INDEPENDENT_AMBULATORY_CARE_PROVIDER_SITE_OTHER): Payer: BC Managed Care – PPO

## 2012-01-18 DIAGNOSIS — J309 Allergic rhinitis, unspecified: Secondary | ICD-10-CM

## 2012-01-26 ENCOUNTER — Ambulatory Visit (INDEPENDENT_AMBULATORY_CARE_PROVIDER_SITE_OTHER): Payer: BC Managed Care – PPO

## 2012-01-26 DIAGNOSIS — J309 Allergic rhinitis, unspecified: Secondary | ICD-10-CM

## 2012-02-01 ENCOUNTER — Ambulatory Visit (INDEPENDENT_AMBULATORY_CARE_PROVIDER_SITE_OTHER): Payer: BC Managed Care – PPO

## 2012-02-01 DIAGNOSIS — J309 Allergic rhinitis, unspecified: Secondary | ICD-10-CM

## 2012-02-09 ENCOUNTER — Ambulatory Visit (INDEPENDENT_AMBULATORY_CARE_PROVIDER_SITE_OTHER): Payer: BC Managed Care – PPO

## 2012-02-09 ENCOUNTER — Encounter: Payer: Self-pay | Admitting: Internal Medicine

## 2012-02-09 DIAGNOSIS — J309 Allergic rhinitis, unspecified: Secondary | ICD-10-CM

## 2012-02-16 ENCOUNTER — Ambulatory Visit (INDEPENDENT_AMBULATORY_CARE_PROVIDER_SITE_OTHER): Payer: BC Managed Care – PPO

## 2012-02-16 DIAGNOSIS — J309 Allergic rhinitis, unspecified: Secondary | ICD-10-CM

## 2012-02-22 ENCOUNTER — Ambulatory Visit (INDEPENDENT_AMBULATORY_CARE_PROVIDER_SITE_OTHER): Payer: BC Managed Care – PPO

## 2012-02-22 DIAGNOSIS — J309 Allergic rhinitis, unspecified: Secondary | ICD-10-CM

## 2012-03-01 ENCOUNTER — Ambulatory Visit (INDEPENDENT_AMBULATORY_CARE_PROVIDER_SITE_OTHER): Payer: BC Managed Care – PPO

## 2012-03-01 DIAGNOSIS — J309 Allergic rhinitis, unspecified: Secondary | ICD-10-CM

## 2012-03-08 ENCOUNTER — Ambulatory Visit (INDEPENDENT_AMBULATORY_CARE_PROVIDER_SITE_OTHER): Payer: BC Managed Care – PPO

## 2012-03-08 DIAGNOSIS — J309 Allergic rhinitis, unspecified: Secondary | ICD-10-CM

## 2012-03-16 ENCOUNTER — Ambulatory Visit (INDEPENDENT_AMBULATORY_CARE_PROVIDER_SITE_OTHER): Payer: BC Managed Care – PPO

## 2012-03-16 DIAGNOSIS — J309 Allergic rhinitis, unspecified: Secondary | ICD-10-CM

## 2012-03-22 ENCOUNTER — Ambulatory Visit (INDEPENDENT_AMBULATORY_CARE_PROVIDER_SITE_OTHER): Payer: BC Managed Care – PPO

## 2012-03-22 DIAGNOSIS — J309 Allergic rhinitis, unspecified: Secondary | ICD-10-CM

## 2012-03-29 ENCOUNTER — Ambulatory Visit (INDEPENDENT_AMBULATORY_CARE_PROVIDER_SITE_OTHER): Payer: BC Managed Care – PPO

## 2012-03-29 DIAGNOSIS — J309 Allergic rhinitis, unspecified: Secondary | ICD-10-CM

## 2012-04-05 ENCOUNTER — Ambulatory Visit (INDEPENDENT_AMBULATORY_CARE_PROVIDER_SITE_OTHER): Payer: BC Managed Care – PPO | Admitting: Internal Medicine

## 2012-04-05 ENCOUNTER — Ambulatory Visit (INDEPENDENT_AMBULATORY_CARE_PROVIDER_SITE_OTHER): Payer: BC Managed Care – PPO

## 2012-04-05 ENCOUNTER — Encounter: Payer: Self-pay | Admitting: Internal Medicine

## 2012-04-05 VITALS — BP 112/82 | HR 56 | Ht 69.0 in | Wt 196.8 lb

## 2012-04-05 DIAGNOSIS — J309 Allergic rhinitis, unspecified: Secondary | ICD-10-CM

## 2012-04-05 DIAGNOSIS — G4700 Insomnia, unspecified: Secondary | ICD-10-CM

## 2012-04-05 MED ORDER — TEMAZEPAM 15 MG PO CAPS: 15.0000 mg | ORAL_CAPSULE | Freq: Every evening | ORAL | Status: DC | PRN

## 2012-04-05 NOTE — Patient Instructions (Addendum)
Refill script for temazepam  We will hold your vaccine dose at the highest level that doesn't cause local reactions. If 0.04 ml of 1:10 is too much, we will drop back to 1:50/ 0.5 ml and stay with that.   Please call as needed.

## 2012-04-05 NOTE — Progress Notes (Signed)
Patient ID: Jose Mahoney, male    DOB: 12-15-1965, 46 y.o.   MRN: [id number]  HPI 7 yoM former smoker,  followed for chronic insomnia, allergic rhinitis. Last here in October 01, 2010 for allergy testing and started on allergy vaccine. Now building here- at 1:500. We discussed the buildup process, concomittant antihistamines, and anaphyllaxis.  He tried singulair and found in retrospect he was breathing easier in his chest, especially with yard work or Educational psychologist exposure.  Temazepam 15 mg does help sleep, used 1-2x/ week. He feels it helps and is sufficient for his needs for now.  04/02/11- [name] former smoker,  followed for chronic insomnia, allergic rhinitis. Slowly building allergy vaccine now at 1:50, has had some local reactions. He reports fairly smooth Spring season this year- no significant problems. Holding at 0.3 of 1:50. Had continued twice weekly. Still taking Zyrtec.  Temazepam works well for intermittent chronic insomnia, used 2-3 x/ week.   10/07/11- [name] former smoker,  followed for chronic insomnia, allergic rhinitis, here in October 01, 2010 for allergy testing and started on allergy vaccine. Vaccine injection record reviewed. Acute visit complaining of recent onset sore throat with postnasal drainage some cough with light yellow sputum, no fever. Did get flu vaccine. Has been getting allergy vaccine without problems. We discussed pending spring pollen season but agreed the current illness has been viral.  04/05/12- [name] former smoker,  followed for chronic insomnia, allergic rhinitis, here in October 01, 2010 for allergy testing and started on allergy vaccine. Pt states nasal drainage still the same. pt denies sore throat. pt still getting allergy injections once weekly-with mild reactions Vaccine is at 1:10 GH. We discussed recent mild local reactions on 0.63ml. Usually feels congested nose with breakthrough drip. Using Nasalcrom occasionally. Uses Zyrtec or  Benadryl. Insomnia adequately managed with temazepam used a couple of nights per week/well tolerated.  ROS-see HPI Constitutional:   No-   weight loss, night sweats, fevers, chills, fatigue, lassitude. HEENT:   No-  headaches, difficulty swallowing, tooth/dental problems,   +sore throat,       No-  sneezing, itching, ear ache,  +nasal congestion,  +post nasal drip,  CV:  No-   chest pain, orthopnea, PND, swelling in lower extremities, anasarca, dizziness, palpitations Resp: No-   shortness of breath with exertion or at rest.              No-   productive cough,  No non-productive cough,  No- coughing up of blood.      No-   change in color of mucus.  No- wheezing.   Skin: No-   rash or lesions. GI:  No-   heartburn, indigestion, abdominal pain, nausea, vomiting,  GU:  MS:  No-   joint pain or swelling.   Neuro-     nothing unusual Psych:  No- change in mood or affect. No depression or anxiety.  No memory loss.  Objective:   Physical Exam General- Alert, Oriented, Affect-appropriate, Distress- none acute, fit-appearing Skin- rash-none, lesions- none, excoriation- none Lymphadenopathy- none Head- atraumatic            Eyes- Gross vision intact, PERRLA, conjunctivae clear secretions            Ears- Hearing, canals            Nose- Clear, No-Septal dev, mucus, polyps, erosion, perforation. nasal mucosa looks normal Throat- Mallampati III , mucosa red , drainage- none, tonsils- atrophic Neck- flexible , trachea  midline, no stridor , thyroid nl, carotid no bruit Chest - symmetrical excursion , unlabored           Heart/CV- RRR , no murmur , no gallop  , no rub, nl s1 s2                           - JVD- none , edema- none, stasis changes- none, varices- none           Lung- clear to P&A, wheeze- none, cough- none , dullness-none, rub- none           Chest wall-  Abd-  Br/ Gen/ Rectal- Not done, not indicated Extrem- cyanosis- none, clubbing, none, atrophy- none, strength- nl Neuro-  grossly intact to observation

## 2012-04-10 NOTE — Assessment & Plan Note (Signed)
Discussed with allergy lab. To try 0.04 NL's of 1:10 again today. If local reaction, drop back to 1:50 0.5 and hold there

## 2012-04-10 NOTE — Assessment & Plan Note (Signed)
Sleep hygiene and occasional temazepam have helped

## 2012-04-13 ENCOUNTER — Ambulatory Visit (INDEPENDENT_AMBULATORY_CARE_PROVIDER_SITE_OTHER): Payer: BC Managed Care – PPO

## 2012-04-13 DIAGNOSIS — J309 Allergic rhinitis, unspecified: Secondary | ICD-10-CM

## 2012-04-18 ENCOUNTER — Ambulatory Visit (INDEPENDENT_AMBULATORY_CARE_PROVIDER_SITE_OTHER): Payer: BC Managed Care – PPO

## 2012-04-18 DIAGNOSIS — J309 Allergic rhinitis, unspecified: Secondary | ICD-10-CM

## 2012-04-26 ENCOUNTER — Ambulatory Visit (INDEPENDENT_AMBULATORY_CARE_PROVIDER_SITE_OTHER): Payer: BC Managed Care – PPO

## 2012-04-26 DIAGNOSIS — J309 Allergic rhinitis, unspecified: Secondary | ICD-10-CM

## 2012-05-03 ENCOUNTER — Ambulatory Visit (INDEPENDENT_AMBULATORY_CARE_PROVIDER_SITE_OTHER): Payer: BC Managed Care – PPO

## 2012-05-03 DIAGNOSIS — J309 Allergic rhinitis, unspecified: Secondary | ICD-10-CM

## 2012-05-04 ENCOUNTER — Encounter: Payer: Self-pay | Admitting: Internal Medicine

## 2012-05-10 ENCOUNTER — Ambulatory Visit (INDEPENDENT_AMBULATORY_CARE_PROVIDER_SITE_OTHER): Payer: BC Managed Care – PPO

## 2012-05-10 DIAGNOSIS — J309 Allergic rhinitis, unspecified: Secondary | ICD-10-CM

## 2012-05-17 ENCOUNTER — Ambulatory Visit (INDEPENDENT_AMBULATORY_CARE_PROVIDER_SITE_OTHER): Payer: BC Managed Care – PPO

## 2012-05-17 DIAGNOSIS — J309 Allergic rhinitis, unspecified: Secondary | ICD-10-CM

## 2012-05-20 ENCOUNTER — Ambulatory Visit (INDEPENDENT_AMBULATORY_CARE_PROVIDER_SITE_OTHER): Payer: BC Managed Care – PPO

## 2012-05-20 DIAGNOSIS — Z23 Encounter for immunization: Secondary | ICD-10-CM

## 2012-05-20 DIAGNOSIS — J309 Allergic rhinitis, unspecified: Secondary | ICD-10-CM

## 2012-05-23 DIAGNOSIS — Z23 Encounter for immunization: Secondary | ICD-10-CM

## 2012-05-24 ENCOUNTER — Ambulatory Visit (INDEPENDENT_AMBULATORY_CARE_PROVIDER_SITE_OTHER): Payer: BC Managed Care – PPO

## 2012-05-24 DIAGNOSIS — J309 Allergic rhinitis, unspecified: Secondary | ICD-10-CM

## 2012-05-31 ENCOUNTER — Ambulatory Visit (INDEPENDENT_AMBULATORY_CARE_PROVIDER_SITE_OTHER): Payer: BC Managed Care – PPO

## 2012-05-31 DIAGNOSIS — J309 Allergic rhinitis, unspecified: Secondary | ICD-10-CM

## 2012-06-07 ENCOUNTER — Ambulatory Visit (INDEPENDENT_AMBULATORY_CARE_PROVIDER_SITE_OTHER): Payer: BC Managed Care – PPO

## 2012-06-07 DIAGNOSIS — J309 Allergic rhinitis, unspecified: Secondary | ICD-10-CM

## 2012-06-14 ENCOUNTER — Ambulatory Visit (INDEPENDENT_AMBULATORY_CARE_PROVIDER_SITE_OTHER): Payer: BC Managed Care – PPO

## 2012-06-14 DIAGNOSIS — J309 Allergic rhinitis, unspecified: Secondary | ICD-10-CM

## 2012-06-23 ENCOUNTER — Ambulatory Visit (INDEPENDENT_AMBULATORY_CARE_PROVIDER_SITE_OTHER): Payer: BC Managed Care – PPO

## 2012-06-23 DIAGNOSIS — J309 Allergic rhinitis, unspecified: Secondary | ICD-10-CM

## 2012-06-29 ENCOUNTER — Ambulatory Visit (INDEPENDENT_AMBULATORY_CARE_PROVIDER_SITE_OTHER): Payer: BC Managed Care – PPO

## 2012-06-29 DIAGNOSIS — J309 Allergic rhinitis, unspecified: Secondary | ICD-10-CM

## 2012-07-04 ENCOUNTER — Other Ambulatory Visit: Payer: Self-pay | Admitting: Internal Medicine

## 2012-07-07 ENCOUNTER — Ambulatory Visit (INDEPENDENT_AMBULATORY_CARE_PROVIDER_SITE_OTHER): Payer: BC Managed Care – PPO

## 2012-07-07 DIAGNOSIS — J309 Allergic rhinitis, unspecified: Secondary | ICD-10-CM

## 2012-07-13 ENCOUNTER — Ambulatory Visit (INDEPENDENT_AMBULATORY_CARE_PROVIDER_SITE_OTHER): Payer: BC Managed Care – PPO

## 2012-07-13 DIAGNOSIS — J309 Allergic rhinitis, unspecified: Secondary | ICD-10-CM

## 2012-07-19 ENCOUNTER — Ambulatory Visit (INDEPENDENT_AMBULATORY_CARE_PROVIDER_SITE_OTHER): Payer: BC Managed Care – PPO

## 2012-07-19 DIAGNOSIS — J309 Allergic rhinitis, unspecified: Secondary | ICD-10-CM

## 2012-07-25 ENCOUNTER — Ambulatory Visit (INDEPENDENT_AMBULATORY_CARE_PROVIDER_SITE_OTHER): Payer: BC Managed Care – PPO

## 2012-07-25 DIAGNOSIS — J309 Allergic rhinitis, unspecified: Secondary | ICD-10-CM

## 2012-08-03 ENCOUNTER — Ambulatory Visit (INDEPENDENT_AMBULATORY_CARE_PROVIDER_SITE_OTHER): Payer: BC Managed Care – PPO

## 2012-08-03 DIAGNOSIS — J309 Allergic rhinitis, unspecified: Secondary | ICD-10-CM

## 2012-08-09 ENCOUNTER — Ambulatory Visit (INDEPENDENT_AMBULATORY_CARE_PROVIDER_SITE_OTHER): Payer: BC Managed Care – PPO

## 2012-08-09 DIAGNOSIS — J309 Allergic rhinitis, unspecified: Secondary | ICD-10-CM

## 2012-08-16 ENCOUNTER — Ambulatory Visit (INDEPENDENT_AMBULATORY_CARE_PROVIDER_SITE_OTHER): Payer: BC Managed Care – PPO

## 2012-08-16 DIAGNOSIS — J309 Allergic rhinitis, unspecified: Secondary | ICD-10-CM

## 2012-08-22 ENCOUNTER — Ambulatory Visit (INDEPENDENT_AMBULATORY_CARE_PROVIDER_SITE_OTHER): Payer: BC Managed Care – PPO | Admitting: Internal Medicine

## 2012-08-22 ENCOUNTER — Encounter: Payer: Self-pay | Admitting: Internal Medicine

## 2012-08-22 ENCOUNTER — Ambulatory Visit (INDEPENDENT_AMBULATORY_CARE_PROVIDER_SITE_OTHER): Payer: BC Managed Care – PPO

## 2012-08-22 ENCOUNTER — Telehealth: Payer: Self-pay | Admitting: *Deleted

## 2012-08-22 ENCOUNTER — Telehealth: Payer: Self-pay | Admitting: Internal Medicine

## 2012-08-22 VITALS — BP 122/66 | HR 65 | Ht 69.0 in | Wt 198.4 lb

## 2012-08-22 DIAGNOSIS — J309 Allergic rhinitis, unspecified: Secondary | ICD-10-CM

## 2012-08-22 DIAGNOSIS — R21 Rash and other nonspecific skin eruption: Secondary | ICD-10-CM

## 2012-08-22 NOTE — Patient Instructions (Addendum)
Ok to continue the antihistamine by mouth  You could try topical application of either Caladryl., like for poison ivy, or a steroid cream like Topicort. Either is cheap, over the counter.   If this keeps getting worse, let me know.

## 2012-08-22 NOTE — Telephone Encounter (Signed)
Jose Mahoney, CMA 08/22/2012 11:09 AM Signed  Last ov 04-05-12, next ov 10-11-12. I spoke with the pt and he states he woke up this morning and thought he was having an allergic reaction to something but now thinks that he may have ringworm. He states it started out with a circular red spot that is red around the edges and is raised, and that it is white in the middle. He states it itches really bad. He is using benadryl cream to relieve the itching and it helps some, but not much. He states he now has noticed a new spot on his palm of left hand and 2 more on his right hand. Please advise. Jose Mahoney, CMA  Allergies   Allergen  Reactions     Fluticasone Propionate      REACTION: sinus headaches

## 2012-08-22 NOTE — Telephone Encounter (Signed)
Created with wrong provider - corrected-

## 2012-08-22 NOTE — Telephone Encounter (Signed)
Spoke with patient-aware to be here at 3:15pm today to see CY.

## 2012-08-22 NOTE — Progress Notes (Signed)
Patient ID: Jose Mahoney, male    DOB: 1966-07-08, 46 y.o.   MRN: [id number]  HPI 43 yoM former smoker,  followed for chronic insomnia, allergic rhinitis. Last here in October 01, 2010 for allergy testing and started on allergy vaccine. Now building here- at 1:500. We discussed the buildup process, concomittant antihistamines, and anaphyllaxis.  He tried singulair and found in retrospect he was breathing easier in his chest, especially with yard work or Educational psychologist exposure.  Temazepam 15 mg does help sleep, used 1-2x/ week. He feels it helps and is sufficient for his needs for now.  04/02/11- [name] former smoker,  followed for chronic insomnia, allergic rhinitis. Slowly building allergy vaccine now at 1:50, has had some local reactions. He reports fairly smooth Spring season this year- no significant problems. Holding at 0.3 of 1:50. Had continued twice weekly. Still taking Zyrtec.  Temazepam works well for intermittent chronic insomnia, used 2-3 x/ week.   10/07/11- [name] former smoker,  followed for chronic insomnia, allergic rhinitis, here in October 01, 2010 for allergy testing and started on allergy vaccine. Vaccine injection record reviewed. Acute visit complaining of recent onset sore throat with postnasal drainage some cough with light yellow sputum, no fever. Did get flu vaccine. Has been getting allergy vaccine without problems. We discussed pending spring pollen season but agreed the current illness has been viral.  04/05/12- [name] former smoker,  followed for chronic insomnia, allergic rhinitis, here in October 01, 2010 for allergy testing and started on allergy vaccine. Pt states nasal drainage still the same. pt denies sore throat. pt still getting allergy injections once weekly-with mild reactions Vaccine is at 1:10 GH. We discussed recent mild local reactions on 0.41ml. Usually feels congested nose with breakthrough drip. Using Nasalcrom occasionally. Uses Zyrtec or  Benadryl. Insomnia adequately managed with temazepam used a couple of nights per week/well tolerated.  08/22/12- [name] former smoker,  followed for chronic insomnia, allergic rhinitis, here in October 01, 2010 for allergy testing and started on allergy vaccine.     ROS-see HPI Constitutional:   No-   weight loss, night sweats, fevers, chills, fatigue, lassitude. HEENT:   No-  headaches, difficulty swallowing, tooth/dental problems,   +sore throat,       No-  sneezing, itching, ear ache,  +nasal congestion,  +post nasal drip,  CV:  No-   chest pain, orthopnea, PND, swelling in lower extremities, anasarca, dizziness, palpitations Resp: No-   shortness of breath with exertion or at rest.              No-   productive cough,  No non-productive cough,  No- coughing up of blood.      No-   change in color of mucus.  No- wheezing.   Skin: No-   rash or lesions. GI:  No-   heartburn, indigestion, abdominal pain, nausea, vomiting,  GU:  MS:  No-   joint pain or swelling.   Neuro-     nothing unusual Psych:  No- change in mood or affect. No depression or anxiety.  No memory loss.  Objective:   Physical Exam General- Alert, Oriented, Affect-appropriate, Distress- none acute, fit-appearing Skin- rash-none, lesions- none, excoriation- none Lymphadenopathy- none Head- atraumatic            Eyes- Gross vision intact, PERRLA, conjunctivae clear secretions            Ears- Hearing, canals  Nose- Clear, No-Septal dev, mucus, polyps, erosion, perforation. nasal mucosa looks normal Throat- Mallampati III , mucosa red , drainage- none, tonsils- atrophic Neck- flexible , trachea midline, no stridor , thyroid nl, carotid no bruit Chest - symmetrical excursion , unlabored           Heart/CV- RRR , no murmur , no gallop  , no rub, nl s1 s2                           - JVD- none , edema- none, stasis changes- none, varices- none           Lung- clear to P&A, wheeze- none, cough- none ,  dullness-none, rub- none           Chest wall-  Abd-  Br/ Gen/ Rectal- Not done, not indicated Extrem- cyanosis- none, clubbing, none, atrophy- none, strength- nl Neuro- grossly intact to observation          Patient ID: Jose Mahoney, male    DOB: [birth year], 46 y.o.   MRN: [id number]  HPI 11 yoM former smoker,  followed for chronic insomnia, allergic rhinitis. Last here in October 01, 2010 for allergy testing and started on allergy vaccine. Now building here- at 1:500. We discussed the buildup process, concomittant antihistamines, and anaphyllaxis.  He tried singulair and found in retrospect he was breathing easier in his chest, especially with yard work or Educational psychologist exposure.  Temazepam 15 mg does help sleep, used 1-2x/ week. He feels it helps and is sufficient for his needs for now.  04/02/11- [name] former smoker,  followed for chronic insomnia, allergic rhinitis. Slowly building allergy vaccine now at 1:50, has had some local reactions. He reports fairly smooth Spring season this year- no significant problems. Holding at 0.3 of 1:50. Had continued twice weekly. Still taking Zyrtec.  Temazepam works well for intermittent chronic insomnia, used 2-3 x/ week.   10/07/11- [name] former smoker,  followed for chronic insomnia, allergic rhinitis, here in October 01, 2010 for allergy testing and started on allergy vaccine. Vaccine injection record reviewed. Acute visit complaining of recent onset sore throat with postnasal drainage some cough with light yellow sputum, no fever. Did get flu vaccine. Has been getting allergy vaccine without problems. We discussed pending spring pollen season but agreed the current illness has been viral.  04/05/12- [name] former smoker,  followed for chronic insomnia, allergic rhinitis, here in October 01, 2010 for allergy testing and started on allergy vaccine. Pt states nasal drainage still the same. pt denies sore throat. pt still getting allergy injections once  weekly-with mild reactions Vaccine is at 1:10 GH. We discussed recent mild local reactions on 0.50ml. Usually feels congested nose with breakthrough drip. Using Nasalcrom occasionally. Uses Zyrtec or Benadryl. Insomnia adequately managed with temazepam used a couple of nights per week/well tolerated.  08/22/12- [name] former smoker,  followed for chronic insomnia, allergic rhinitis, here in October 01, 2010 for allergy testing and started on allergy vaccine. ACUTE VISIT: new dog for about a month now-noticed rash on hands-unknown cause Pruritus comes and goes. He shows faint rash fading on the back of his left hand. He has an arc-shaped linear red rash on his right thumb. He has been treating with topical Benadryl cream and taking Zyrtec daily. He has cats and has had a new dog for 5 weeks.  ROS-see HPI Constitutional:   No-   weight loss, night sweats, fevers,  chills, fatigue, lassitude. HEENT:   No-  headaches, difficulty swallowing, tooth/dental problems,   +sore throat,       No-  sneezing, +itching, ear ache,  +nasal congestion,  +post nasal drip,  CV:  No-   chest pain, orthopnea, PND, swelling in lower extremities, anasarca, dizziness, palpitations Resp: No-   shortness of breath with exertion or at rest.              No-   productive cough,  No non-productive cough,  No- coughing up of blood.      No-   change in color of mucus.  No- wheezing.   Skin: + Per history of present illness GI:  No-   heartburn, indigestion, abdominal pain, nausea, vomiting,  GU:  MS:  No-   joint pain or swelling.   Neuro-     nothing unusual Psych:  No- change in mood or affect. No depression or anxiety.  No memory loss.  Objective:   Physical Exam General- Alert, Oriented, Affect-appropriate, Distress- none acute, fit-appearing Skin- +rash on right thumb as described, lesions- none, excoriation- none Lymphadenopathy- none Head- atraumatic            Eyes- Gross vision intact, PERRLA, conjunctivae  clear secretions            Ears- Hearing, canals            Nose- Clear, No-Septal dev, mucus, polyps, erosion, perforation. nasal mucosa looks normal Throat- Mallampati III , mucosa red , drainage- none, tonsils- atrophic Neck- flexible , trachea midline, no stridor , thyroid nl, carotid no bruit Chest - symmetrical excursion , unlabored           Heart/CV- RRR , no murmur , no gallop  , no rub, nl s1 s2                           - JVD- none , edema- none, stasis changes- none, varices- none           Lung- clear to P&A, wheeze- none, cough- none , dullness-none, rub- none           Chest wall-  Abd-  Br/ Gen/ Rectal- Not done, not indicated Extrem- cyanosis- none, clubbing, none, atrophy- none, strength- nl Neuro- grossly intact to observation

## 2012-08-22 NOTE — Telephone Encounter (Signed)
Last ov 04-05-12, next ov 10-11-12. I spoke with the pt and he states he woke up this morning and thought he was having an allergic reaction to something but now thinks that he may have ringworm. He states it started out with a circular red spot that is red around the edges and is raised, and that it is white in the middle. He states it itches really bad. He is using benadryl cream to relieve the itching and it helps some, but not much. He states he now has noticed a new spot on his palm of left hand and 2 more on his right hand. Please advise. Carron Curie, CMA Allergies  Allergen Reactions  . Fluticasone Propionate     REACTION: sinus headaches

## 2012-08-30 ENCOUNTER — Encounter: Payer: Self-pay | Admitting: Internal Medicine

## 2012-09-01 ENCOUNTER — Ambulatory Visit (INDEPENDENT_AMBULATORY_CARE_PROVIDER_SITE_OTHER): Payer: BC Managed Care – PPO

## 2012-09-01 ENCOUNTER — Telehealth: Payer: Self-pay | Admitting: Internal Medicine

## 2012-09-01 DIAGNOSIS — J309 Allergic rhinitis, unspecified: Secondary | ICD-10-CM

## 2012-09-01 NOTE — Telephone Encounter (Signed)
Per CY-try lotrimin ointment or Tinactin.

## 2012-09-01 NOTE — Telephone Encounter (Signed)
Spoke with pt He was last seen on 12/23 States that the rash he had was going away but has now returned for the past 2-3 days He states that it looks like ring worm, and itches Not painful but "annoying" Is unsure what caused it to flare- not been around animals at all He states has tried calamine lotion and cortisone cream with very minimal relief Please advise, thanks! Next ov 10/11/12 Allergies  Allergen Reactions  . Fluticasone Propionate     REACTION: sinus headaches

## 2012-09-01 NOTE — Telephone Encounter (Signed)
Pt advised. Narek Kniss, CMA  

## 2012-09-02 DIAGNOSIS — L500 Allergic urticaria: Secondary | ICD-10-CM | POA: Insufficient documentation

## 2012-09-02 NOTE — Assessment & Plan Note (Signed)
Nonspecific rash possibly related to his animals-scratches, allergy. I don't think it is a fungal infection. Plan-Caladryl or Topicort as 2 different approaches. Continues Zyrtec.

## 2012-09-07 ENCOUNTER — Ambulatory Visit (INDEPENDENT_AMBULATORY_CARE_PROVIDER_SITE_OTHER): Payer: BC Managed Care – PPO

## 2012-09-07 DIAGNOSIS — J309 Allergic rhinitis, unspecified: Secondary | ICD-10-CM

## 2012-09-14 ENCOUNTER — Ambulatory Visit (INDEPENDENT_AMBULATORY_CARE_PROVIDER_SITE_OTHER): Payer: BC Managed Care – PPO

## 2012-09-14 DIAGNOSIS — J309 Allergic rhinitis, unspecified: Secondary | ICD-10-CM

## 2012-09-21 ENCOUNTER — Ambulatory Visit: Payer: BC Managed Care – PPO

## 2012-09-22 ENCOUNTER — Ambulatory Visit (INDEPENDENT_AMBULATORY_CARE_PROVIDER_SITE_OTHER): Payer: BC Managed Care – PPO

## 2012-09-22 DIAGNOSIS — J309 Allergic rhinitis, unspecified: Secondary | ICD-10-CM

## 2012-09-28 ENCOUNTER — Ambulatory Visit (INDEPENDENT_AMBULATORY_CARE_PROVIDER_SITE_OTHER): Payer: BC Managed Care – PPO

## 2012-09-28 DIAGNOSIS — J309 Allergic rhinitis, unspecified: Secondary | ICD-10-CM

## 2012-10-05 ENCOUNTER — Ambulatory Visit (INDEPENDENT_AMBULATORY_CARE_PROVIDER_SITE_OTHER): Payer: BC Managed Care – PPO

## 2012-10-05 DIAGNOSIS — J309 Allergic rhinitis, unspecified: Secondary | ICD-10-CM

## 2012-10-07 ENCOUNTER — Other Ambulatory Visit: Payer: Self-pay | Admitting: Internal Medicine

## 2012-10-11 ENCOUNTER — Ambulatory Visit (INDEPENDENT_AMBULATORY_CARE_PROVIDER_SITE_OTHER): Payer: BC Managed Care – PPO | Admitting: Internal Medicine

## 2012-10-11 ENCOUNTER — Other Ambulatory Visit: Payer: BC Managed Care – PPO

## 2012-10-11 ENCOUNTER — Encounter: Payer: Self-pay | Admitting: Internal Medicine

## 2012-10-11 ENCOUNTER — Ambulatory Visit (INDEPENDENT_AMBULATORY_CARE_PROVIDER_SITE_OTHER): Payer: BC Managed Care – PPO

## 2012-10-11 VITALS — BP 130/72 | HR 69 | Ht 69.0 in | Wt 204.6 lb

## 2012-10-11 DIAGNOSIS — J302 Other seasonal allergic rhinitis: Secondary | ICD-10-CM

## 2012-10-11 DIAGNOSIS — L500 Allergic urticaria: Secondary | ICD-10-CM

## 2012-10-11 DIAGNOSIS — J309 Allergic rhinitis, unspecified: Secondary | ICD-10-CM

## 2012-10-11 DIAGNOSIS — J301 Allergic rhinitis due to pollen: Secondary | ICD-10-CM

## 2012-10-11 MED ORDER — MONTELUKAST SODIUM 10 MG PO TABS: 10.0000 mg | ORAL_TABLET | Freq: Every day | ORAL | Status: DC

## 2012-10-11 NOTE — Patient Instructions (Addendum)
Order- lab Allergy profile   Dx allergic rhinitis to pollen  We will consider adding cat and dog as a build-up parallel to your current vaccine  You will want to ask your Vet to change the flea and tick product to a different active ingredient entirely

## 2012-10-11 NOTE — Progress Notes (Signed)
Patient ID: Jose Mahoney, male    DOB: Nov 28, 1965, 47 y.o.   MRN: [id number]  HPI 47 yoM former smoker,  followed for chronic insomnia, allergic rhinitis. Last here in October 01, 2010 for allergy testing and started on allergy vaccine. Now building here- at 1:500. We discussed the buildup process, concomittant antihistamines, and anaphyllaxis.  He tried singulair and found in retrospect he was breathing easier in his chest, especially with yard work or Educational psychologist exposure.  Temazepam 15 mg does help sleep, used 1-2x/ week. He feels it helps and is sufficient for his needs for now.  04/02/11- 47 yoM former smoker,  followed for chronic insomnia, allergic rhinitis. Slowly building allergy vaccine now at 1:50, has had some local reactions. He reports fairly smooth Spring season this year- no significant problems. Holding at 0.3 of 1:50. Had continued twice weekly. Still taking Zyrtec.  Temazepam works well for intermittent chronic insomnia, used 2-3 x/ week.   10/07/11- 47 yoM former smoker,  followed for chronic insomnia, allergic rhinitis, here in October 01, 2010 for allergy testing and started on allergy vaccine. Vaccine injection record reviewed. Acute visit complaining of recent onset sore throat with postnasal drainage some cough with light yellow sputum, no fever. Did get flu vaccine. Has been getting allergy vaccine without problems. We discussed pending spring pollen season but agreed the current illness has been viral.  04/05/12- 47 yoM former smoker,  followed for chronic insomnia, allergic rhinitis, here in October 01, 2010 for allergy testing and started on allergy vaccine. Pt states nasal drainage still the same. pt denies sore throat. pt still getting allergy injections once weekly-with mild reactions Vaccine is at 1:10 GH. We discussed recent mild local reactions on 0.65ml. Usually feels congested nose with breakthrough drip. Using Nasalcrom occasionally. Uses Zyrtec or  Benadryl. Insomnia adequately managed with temazepam used a couple of nights per week/well tolerated.  08/22/12- 47 yoM former smoker,  followed for chronic insomnia, allergic rhinitis, here in October 01, 2010 for allergy testing and started on allergy vaccine.     ROS-see HPI Constitutional:   No-   weight loss, night sweats, fevers, chills, fatigue, lassitude. HEENT:   No-  headaches, difficulty swallowing, tooth/dental problems,   +sore throat,       No-  sneezing, itching, ear ache,  +nasal congestion,  +post nasal drip,  CV:  No-   chest pain, orthopnea, PND, swelling in lower extremities, anasarca, dizziness, palpitations Resp: No-   shortness of breath with exertion or at rest.              No-   productive cough,  No non-productive cough,  No- coughing up of blood.      No-   change in color of mucus.  No- wheezing.   Skin: No-   rash or lesions. GI:  No-   heartburn, indigestion, abdominal pain, nausea, vomiting,  GU:  MS:  No-   joint pain or swelling.   Neuro-     nothing unusual Psych:  No- change in mood or affect. No depression or anxiety.  No memory loss.  Objective:   Physical Exam General- Alert, Oriented, Affect-appropriate, Distress- none acute, fit-appearing Skin- rash-none, lesions- none, excoriation- none Lymphadenopathy- none Head- atraumatic            Eyes- Gross vision intact, PERRLA, conjunctivae clear secretions            Ears- Hearing, canals  Nose- Clear, No-Septal dev, mucus, polyps, erosion, perforation. nasal mucosa looks normal Throat- Mallampati III , mucosa red , drainage- none, tonsils- atrophic Neck- flexible , trachea midline, no stridor , thyroid nl, carotid no bruit Chest - symmetrical excursion , unlabored           Heart/CV- RRR , no murmur , no gallop  , no rub, nl s1 s2                           - JVD- none , edema- none, stasis changes- none, varices- none           Lung- clear to P&A, wheeze- none, cough- none ,  dullness-none, rub- none           Chest wall-  Abd-  Br/ Gen/ Rectal- Not done, not indicated Extrem- cyanosis- none, clubbing, none, atrophy- none, strength- nl Neuro- grossly intact to observation          Patient ID: Jose Mahoney, male    DOB: Sep 14, 1965, 47 y.o.   MRN: [id number]  HPI 28 yoM former smoker,  followed for chronic insomnia, allergic rhinitis. Last here in October 01, 2010 for allergy testing and started on allergy vaccine. Now building here- at 1:500. We discussed the buildup process, concomittant antihistamines, and anaphyllaxis.  He tried singulair and found in retrospect he was breathing easier in his chest, especially with yard work or Educational psychologist exposure.  Temazepam 15 mg does help sleep, used 1-2x/ week. He feels it helps and is sufficient for his needs for now.  04/02/11- 47 yoM former smoker,  followed for chronic insomnia, allergic rhinitis. Slowly building allergy vaccine now at 1:50, has had some local reactions. He reports fairly smooth Spring season this year- no significant problems. Holding at 0.3 of 1:50. Had continued twice weekly. Still taking Zyrtec.  Temazepam works well for intermittent chronic insomnia, used 2-3 x/ week.   10/07/11- 47 yoM former smoker,  followed for chronic insomnia, allergic rhinitis, here in October 01, 2010 for allergy testing and started on allergy vaccine. Vaccine injection record reviewed. Acute visit complaining of recent onset sore throat with postnasal drainage some cough with light yellow sputum, no fever. Did get flu vaccine. Has been getting allergy vaccine without problems. We discussed pending spring pollen season but agreed the current illness has been viral.  04/05/12- 47 yoM former smoker,  followed for chronic insomnia, allergic rhinitis, here in October 01, 2010 for allergy testing and started on allergy vaccine. Pt states nasal drainage still the same. pt denies sore throat. pt still getting allergy injections once  weekly-with mild reactions Vaccine is at 1:10 GH. We discussed recent mild local reactions on 0.66ml. Usually feels congested nose with breakthrough drip. Using Nasalcrom occasionally. Uses Zyrtec or Benadryl. Insomnia adequately managed with temazepam used a couple of nights per week/well tolerated.  08/22/12- 47 yoM former smoker,  followed for chronic insomnia, allergic rhinitis, here in October 01, 2010 for allergy testing and started on allergy vaccine. ACUTE VISIT: new dog for about a month now-noticed rash on hands-unknown cause Pruritus comes and goes. He shows faint rash fading on the back of his left hand. He has an arc-shaped linear red rash on his right thumb. He has been treating with topical Benadryl cream and taking Zyrtec daily. He has cats and has had a new dog for 5 weeks.  10/11/12- 47 yoM former smoker,  followed for chronic insomnia, allergic rhinitis,urticaria- here  in October 01, 2010 for allergy testing and started on allergy vaccine. follows for: pt states stil breaking out in hives,not sure if this is caused by flea an tick oil for dog. has .some sob,wheezing,.states that allergy injections 1:10 GH seems to be helping rinitis.  Got a new dog. 2 months later onset of isolated urticaria on hands. Started new flea and tick medication 3 weeks before onset of hives.  ROS-see HPI Constitutional:   No-   weight loss, night sweats, fevers, chills, fatigue, lassitude. HEENT:   No-  headaches, difficulty swallowing, tooth/dental problems,   +sore throat,       No-  sneezing, +itching, ear ache,  +nasal congestion,  +post nasal drip,  CV:  No-   chest pain, orthopnea, PND, swelling in lower extremities, anasarca, dizziness, palpitations Resp: No-   shortness of breath with exertion or at rest.              No-   productive cough,  No non-productive cough,  No- coughing up of blood.      No-   change in color of mucus.  No- wheezing.   Skin: + Per history of present illness GI:  No-    heartburn, indigestion, abdominal pain, nausea, vomiting,  GU:  MS:  No-   joint pain or swelling.   Neuro-     nothing unusual Psych:  No- change in mood or affect. No depression or anxiety.  No memory loss.  Objective:   Physical Exam General- Alert, Oriented, Affect-appropriate, Distress- none acute, fit-appearing Skin- +shows the pictures on his cell phone of rash on his hands-raised lesions with darker borders, excoriation- none Lymphadenopathy- none Head- atraumatic            Eyes- Gross vision intact, PERRLA, conjunctivae clear secretions            Ears- Hearing, canals            Nose- Clear, No-Septal dev, mucus, polyps, erosion, perforation. nasal mucosa looks normal Throat- Mallampati III , mucosa red , drainage- none, tonsils- atrophic Neck- flexible , trachea midline, no stridor , thyroid nl, carotid no bruit Chest - symmetrical excursion , unlabored           Heart/CV- RRR , no murmur , no gallop  , no rub, nl s1 s2                           - JVD- none , edema- none, stasis changes- none, varices- none           Lung- clear to P&A, wheeze- none, cough- none , dullness-none, rub- none           Chest wall-  Abd-  Br/ Gen/ Rectal- Not done, not indicated Extrem- cyanosis- none, clubbing, none, atrophy- none, strength- nl Neuro- grossly intact to observation

## 2012-10-12 ENCOUNTER — Ambulatory Visit: Payer: BC Managed Care – PPO

## 2012-10-12 LAB — ALLERGY FULL PROFILE
Allergen, D pternoyssinus,d7: 5.03 kU/L — ABNORMAL HIGH
Alternaria Alternata: <0.1 kU/L
Bahia Grass: 16.5 kU/L — ABNORMAL HIGH
Candida Albicans: <0.1 kU/L
Common Ragweed: 11.6 kU/L — ABNORMAL HIGH
D. farinae: 5.28 kU/L — ABNORMAL HIGH
Dog Dander: 11.8 kU/L — ABNORMAL HIGH
Elm IgE: 3.08 kU/L — ABNORMAL HIGH
Plantain: 3.38 kU/L — ABNORMAL HIGH
Stemphylium Botryosum: <0.1 kU/L
Timothy Grass: 15 kU/L — ABNORMAL HIGH

## 2012-10-16 NOTE — Assessment & Plan Note (Signed)
I don't think this is related to his allergy vaccine. Either new dog or flea and tick medication for the dog may be triggering Plan-allergy profile. Tried changing the flea and tick medication. Consider adding animal danders his vaccine as discussed. Refill Singulair.

## 2012-10-16 NOTE — Assessment & Plan Note (Signed)
Continues allergy vaccine, well tolerated.

## 2012-10-17 NOTE — Progress Notes (Signed)
Quick Note:  LMTCB ______ 

## 2012-10-18 ENCOUNTER — Telehealth: Payer: Self-pay | Admitting: Internal Medicine

## 2012-10-18 NOTE — Telephone Encounter (Signed)
Notes Recorded by Waymon Budge, MD on 10/12/2012 at 7:52 PM High allergy antibody levels. We will discuss at next ov.  I spoke with patient about results and he verbalized understanding and had no questions.

## 2012-10-19 ENCOUNTER — Ambulatory Visit (INDEPENDENT_AMBULATORY_CARE_PROVIDER_SITE_OTHER): Payer: BC Managed Care – PPO

## 2012-10-19 DIAGNOSIS — J309 Allergic rhinitis, unspecified: Secondary | ICD-10-CM

## 2012-10-26 ENCOUNTER — Ambulatory Visit (INDEPENDENT_AMBULATORY_CARE_PROVIDER_SITE_OTHER): Payer: BC Managed Care – PPO

## 2012-10-26 DIAGNOSIS — J309 Allergic rhinitis, unspecified: Secondary | ICD-10-CM

## 2012-10-31 ENCOUNTER — Ambulatory Visit (INDEPENDENT_AMBULATORY_CARE_PROVIDER_SITE_OTHER): Payer: BC Managed Care – PPO

## 2012-10-31 DIAGNOSIS — J309 Allergic rhinitis, unspecified: Secondary | ICD-10-CM

## 2012-11-02 ENCOUNTER — Ambulatory Visit (INDEPENDENT_AMBULATORY_CARE_PROVIDER_SITE_OTHER): Payer: BC Managed Care – PPO

## 2012-11-02 DIAGNOSIS — J309 Allergic rhinitis, unspecified: Secondary | ICD-10-CM

## 2012-11-09 ENCOUNTER — Ambulatory Visit: Payer: BC Managed Care – PPO

## 2012-11-10 ENCOUNTER — Ambulatory Visit (INDEPENDENT_AMBULATORY_CARE_PROVIDER_SITE_OTHER): Payer: BC Managed Care – PPO

## 2012-11-10 DIAGNOSIS — J309 Allergic rhinitis, unspecified: Secondary | ICD-10-CM

## 2012-11-16 ENCOUNTER — Ambulatory Visit (INDEPENDENT_AMBULATORY_CARE_PROVIDER_SITE_OTHER): Payer: BC Managed Care – PPO

## 2012-11-16 DIAGNOSIS — J309 Allergic rhinitis, unspecified: Secondary | ICD-10-CM

## 2012-11-23 ENCOUNTER — Ambulatory Visit: Payer: BC Managed Care – PPO

## 2012-11-24 ENCOUNTER — Ambulatory Visit (INDEPENDENT_AMBULATORY_CARE_PROVIDER_SITE_OTHER): Payer: BC Managed Care – PPO

## 2012-11-24 DIAGNOSIS — J309 Allergic rhinitis, unspecified: Secondary | ICD-10-CM

## 2012-11-30 ENCOUNTER — Ambulatory Visit (INDEPENDENT_AMBULATORY_CARE_PROVIDER_SITE_OTHER): Payer: BC Managed Care – PPO

## 2012-11-30 DIAGNOSIS — J309 Allergic rhinitis, unspecified: Secondary | ICD-10-CM

## 2012-12-07 ENCOUNTER — Ambulatory Visit (INDEPENDENT_AMBULATORY_CARE_PROVIDER_SITE_OTHER): Payer: BC Managed Care – PPO

## 2012-12-07 DIAGNOSIS — J309 Allergic rhinitis, unspecified: Secondary | ICD-10-CM

## 2012-12-15 ENCOUNTER — Ambulatory Visit (INDEPENDENT_AMBULATORY_CARE_PROVIDER_SITE_OTHER): Payer: BC Managed Care – PPO

## 2012-12-15 DIAGNOSIS — J309 Allergic rhinitis, unspecified: Secondary | ICD-10-CM

## 2012-12-21 ENCOUNTER — Ambulatory Visit (INDEPENDENT_AMBULATORY_CARE_PROVIDER_SITE_OTHER): Payer: BC Managed Care – PPO

## 2012-12-21 DIAGNOSIS — J309 Allergic rhinitis, unspecified: Secondary | ICD-10-CM

## 2012-12-28 ENCOUNTER — Ambulatory Visit (INDEPENDENT_AMBULATORY_CARE_PROVIDER_SITE_OTHER): Payer: BC Managed Care – PPO

## 2012-12-28 DIAGNOSIS — J309 Allergic rhinitis, unspecified: Secondary | ICD-10-CM

## 2013-01-04 ENCOUNTER — Ambulatory Visit (INDEPENDENT_AMBULATORY_CARE_PROVIDER_SITE_OTHER): Payer: BC Managed Care – PPO

## 2013-01-04 DIAGNOSIS — J309 Allergic rhinitis, unspecified: Secondary | ICD-10-CM

## 2013-01-10 ENCOUNTER — Ambulatory Visit (INDEPENDENT_AMBULATORY_CARE_PROVIDER_SITE_OTHER): Payer: BC Managed Care – PPO | Admitting: Internal Medicine

## 2013-01-10 ENCOUNTER — Encounter: Payer: Self-pay | Admitting: Internal Medicine

## 2013-01-10 ENCOUNTER — Ambulatory Visit (INDEPENDENT_AMBULATORY_CARE_PROVIDER_SITE_OTHER): Payer: BC Managed Care – PPO

## 2013-01-10 VITALS — BP 120/66 | HR 66 | Ht 69.0 in | Wt 209.0 lb

## 2013-01-10 DIAGNOSIS — J45909 Unspecified asthma, uncomplicated: Secondary | ICD-10-CM

## 2013-01-10 DIAGNOSIS — L500 Allergic urticaria: Secondary | ICD-10-CM

## 2013-01-10 DIAGNOSIS — J309 Allergic rhinitis, unspecified: Secondary | ICD-10-CM

## 2013-01-10 DIAGNOSIS — J3089 Other allergic rhinitis: Secondary | ICD-10-CM

## 2013-01-10 DIAGNOSIS — J45998 Other asthma: Secondary | ICD-10-CM

## 2013-01-10 MED ORDER — OMALIZUMAB 150 MG ~~LOC~~ SOLR: 300.0000 mg | SUBCUTANEOUS | Status: DC

## 2013-01-10 NOTE — Assessment & Plan Note (Signed)
Significantly better with allergy vaccine, but has also added high dose antihistamines

## 2013-01-10 NOTE — Assessment & Plan Note (Signed)
We discussed the importance of controlling environmental exposures especially to animals. Compared allergy vaccine, which he says has really helped, with option to add cat and dog gradually to his mix. Another option would be to try Xolair, which is approved for chronic urticaria and for asthma. We discussed risk of anaphyllaxis and management options, answering his questions.

## 2013-01-10 NOTE — Assessment & Plan Note (Addendum)
We discussed his allergy vaccine and compared it to Xolair risk and goals.  Discussed environmental precautions. Plan- Xolair trial hoping to benefit both asthma and chronic urticaria.  Add H2 antihistamine

## 2013-01-10 NOTE — Progress Notes (Signed)
Patient ID: Jose Mahoney, male    DOB: May 14, 1966, 47 y.o.   MRN: [id number]  HPI [name] former smoker,  followed for chronic insomnia, allergic rhinitis. Last here in October 01, 2010 for allergy testing and started on allergy vaccine. Now building here- at 1:500. We discussed the buildup process, concomittant antihistamines, and anaphyllaxis.  He tried singulair and found in retrospect he was breathing easier in his chest, especially with yard work or Educational psychologistanimal exposure.  Temazepam 15 mg does help sleep, used 1-2x/ week. He feels it helps and is sufficient for his needs for now.  04/02/11- [name] former smoker,  followed for chronic insomnia, allergic rhinitis. Slowly building allergy vaccine now at 1:50, has had some local reactions. He reports fairly smooth Spring season this year- no significant problems. Holding at 0.3 of 1:50. Had continued twice weekly. Still taking Zyrtec.  Temazepam works well for intermittent chronic insomnia, used 2-3 x/ week.   10/07/11- [name] former smoker,  followed for chronic insomnia, allergic rhinitis, here in October 01, 2010 for allergy testing and started on allergy vaccine. Vaccine injection record reviewed. Acute visit complaining of recent onset sore throat with postnasal drainage some cough with light yellow sputum, no fever. Did get flu vaccine. Has been getting allergy vaccine without problems. We discussed pending spring pollen season but agreed the current illness has been viral.  04/05/12- [name] former smoker,  followed for chronic insomnia, allergic rhinitis, here in October 01, 2010 for allergy testing and started on allergy vaccine. Pt states nasal drainage still the same. pt denies sore throat. pt still getting allergy injections once weekly-with mild reactions Vaccine is at 1:10 GH. We discussed recent mild local reactions on 0.2604ml. Usually feels congested nose with breakthrough drip. Using Nasalcrom occasionally. Uses Zyrtec or  Benadryl. Insomnia adequately managed with temazepam used a couple of nights per week/well tolerated.  08/22/12- [name] former smoker,  followed for chronic insomnia, allergic rhinitis, here in October 01, 2010 for allergy testing and started on allergy vaccine. ACUTE VISIT: new dog for about a month now-noticed rash on hands-unknown cause Pruritus comes and goes. He shows faint rash fading on the back of his left hand. He has an arc-shaped linear red rash on his right thumb. He has been treating with topical Benadryl cream and taking Zyrtec daily. He has cats and has had a new dog for 5 weeks.  10/11/12- [name] former smoker,  followed for chronic insomnia, allergic rhinitis,urticaria- here in October 01, 2010 for allergy testing and started on allergy vaccine. follows for: pt states stil breaking out in hives,not sure if this is caused by flea an tick oil for dog. has .some sob,wheezing,.states that allergy injections 1:10 GH seems to be helping rinitis.  Got a new dog. 2 months later onset of isolated urticaria on hands. Started new flea and tick medication 3 weeks before onset of hives.  01/10/13- [name] former smoker,  followed for chronic insomnia, allergic rhinitis,urticaria FOLLOWS FOR: still on vaccine; review allergy lab work with patient. Still having break outs of hives. Lives w/ girlfriend and together they have a lot of animals. Has had urticaria 3 out of 4 weeks, including onset while out of town, while mowing yard.  Daily Zyrtec 2 in AM, 2in PM suggested by dermatologist. Also occ benadryl. Notes wheeze if skips singulair. Allergy Profile 10/11/12 Total IgE 1044, High for cat and for dust/ mite, grasses, trees, weeds.  Continues allergy vaccine 1:10 here.  Mix w/o cat/dog.   ROS-see HPI Constitutional:   No-   weight loss, night sweats, fevers, chills, fatigue, lassitude. HEENT:   No-  headaches, difficulty swallowing, tooth/dental problems,  sore throat,       No-  sneezing,  +itching, ear ache,  +nasal congestion,  +post nasal drip,  CV:  No-   chest pain, orthopnea, PND, swelling in lower extremities, anasarca, dizziness, palpitations Resp: No-   shortness of breath with exertion or at rest.              No-   productive cough,  No non-productive cough,  No- coughing up of blood.      No-   change in color of mucus.  +occasional wheezing.   Skin: + recurrent hives GI:  No-   heartburn, indigestion, abdominal pain, nausea, vomiting,  GU:  MS:  No-   joint pain or swelling.   Neuro-     nothing unusual Psych:  No- change in mood or affect. No depression or anxiety.  No memory loss.  Objective:   Physical Exam General- Alert, Oriented, Affect-appropriate, Distress- none acute, fit-appearing Skin- ,excoriation- none, No rash demonstrated at this visit.  Lymphadenopathy- none Head- atraumatic            Eyes- Gross vision intact, PERRLA, conjunctivae clear secretions            Ears- Hearing, canals            Nose- Clear, No-Septal dev, mucus, polyps, erosion, perforation. nasal mucosa looks normal Throat- Mallampati III , mucosa red , drainage- none, tonsils- atrophic Neck- flexible , trachea midline, no stridor , thyroid nl, carotid no bruit Chest - symmetrical excursion , unlabored           Heart/CV- RRR , no murmur , no gallop  , no rub, nl s1 s2                           - JVD- none , edema- none, stasis changes- none, varices- none           Lung- clear to P&A, wheeze- none, cough- none , dullness-none, rub- none           Chest wall-  Abd-  Br/ Gen/ Rectal- Not done, not indicated Extrem- cyanosis- none, clubbing, none, atrophy- none, strength- nl Neuro- grossly intact to observation

## 2013-01-10 NOTE — Patient Instructions (Addendum)
Order- Start application process for Xolair for dx chronic urticaria, asthma  Ok to continue high dose antihistamines for now  Add H2 antihistamine Zantac/ ranitidine 300 mg, twice daily for 1 month trial

## 2013-01-11 ENCOUNTER — Ambulatory Visit: Payer: BC Managed Care – PPO

## 2013-01-12 ENCOUNTER — Telehealth: Payer: Self-pay | Admitting: Internal Medicine

## 2013-01-12 NOTE — Telephone Encounter (Signed)
Called and spoke with pt and he stated that he did his own research of the xolair and he stated that he has a family hx of heart problems in his immediate family so he does not want to start on the xolair.   He did state that CY spoke with him at his OV about adding dog and cat to his current allergy shots per week, and pt stated that he would rather go down this path at this time.  CY please advise.  Thanks  Last ov--01/10/13 Next ov--03/06/2013   Allergies  Allergen Reactions  . Fluticasone Propionate     REACTION: sinus headaches

## 2013-01-13 ENCOUNTER — Ambulatory Visit (INDEPENDENT_AMBULATORY_CARE_PROVIDER_SITE_OTHER): Payer: BC Managed Care – PPO

## 2013-01-13 DIAGNOSIS — J309 Allergic rhinitis, unspecified: Secondary | ICD-10-CM

## 2013-01-13 NOTE — Telephone Encounter (Signed)
Noted. Instructions given to Allergy [facility] to build cat and dog as a separate vial/ injection, starting at 1:50,000.

## 2013-01-13 NOTE — Telephone Encounter (Signed)
Ok to build by tenths, standard protocol. Let me know if problems.

## 2013-01-13 NOTE — Telephone Encounter (Signed)
Noted! Thank you

## 2013-01-13 NOTE — Telephone Encounter (Signed)
I will make this up and have it ready next wk. When pt. Comes in. Please tell me I can build these weaker strengths by tenths and NOT hundredths. At least until he starts having problems.(Hopefully he won't have any.)

## 2013-01-18 ENCOUNTER — Ambulatory Visit (INDEPENDENT_AMBULATORY_CARE_PROVIDER_SITE_OTHER): Payer: BC Managed Care – PPO

## 2013-01-18 DIAGNOSIS — J309 Allergic rhinitis, unspecified: Secondary | ICD-10-CM

## 2013-01-24 ENCOUNTER — Ambulatory Visit: Payer: BC Managed Care – PPO

## 2013-01-25 ENCOUNTER — Ambulatory Visit (INDEPENDENT_AMBULATORY_CARE_PROVIDER_SITE_OTHER): Payer: BC Managed Care – PPO

## 2013-01-25 DIAGNOSIS — J309 Allergic rhinitis, unspecified: Secondary | ICD-10-CM

## 2013-02-01 ENCOUNTER — Ambulatory Visit (INDEPENDENT_AMBULATORY_CARE_PROVIDER_SITE_OTHER): Payer: BC Managed Care – PPO

## 2013-02-01 DIAGNOSIS — J309 Allergic rhinitis, unspecified: Secondary | ICD-10-CM

## 2013-02-08 ENCOUNTER — Ambulatory Visit (INDEPENDENT_AMBULATORY_CARE_PROVIDER_SITE_OTHER): Payer: BC Managed Care – PPO

## 2013-02-08 DIAGNOSIS — J309 Allergic rhinitis, unspecified: Secondary | ICD-10-CM

## 2013-02-15 ENCOUNTER — Ambulatory Visit (INDEPENDENT_AMBULATORY_CARE_PROVIDER_SITE_OTHER): Payer: BC Managed Care – PPO

## 2013-02-15 DIAGNOSIS — J309 Allergic rhinitis, unspecified: Secondary | ICD-10-CM

## 2013-02-22 ENCOUNTER — Ambulatory Visit (INDEPENDENT_AMBULATORY_CARE_PROVIDER_SITE_OTHER): Payer: BC Managed Care – PPO

## 2013-02-22 DIAGNOSIS — J309 Allergic rhinitis, unspecified: Secondary | ICD-10-CM

## 2013-03-01 ENCOUNTER — Ambulatory Visit (INDEPENDENT_AMBULATORY_CARE_PROVIDER_SITE_OTHER): Payer: BC Managed Care – PPO

## 2013-03-01 DIAGNOSIS — J309 Allergic rhinitis, unspecified: Secondary | ICD-10-CM

## 2013-03-06 ENCOUNTER — Encounter: Payer: Self-pay | Admitting: Internal Medicine

## 2013-03-06 ENCOUNTER — Ambulatory Visit (INDEPENDENT_AMBULATORY_CARE_PROVIDER_SITE_OTHER): Payer: BC Managed Care – PPO | Admitting: Internal Medicine

## 2013-03-06 VITALS — BP 100/70 | HR 61 | Ht 69.5 in | Wt 214.4 lb

## 2013-03-06 DIAGNOSIS — J302 Other seasonal allergic rhinitis: Secondary | ICD-10-CM

## 2013-03-06 DIAGNOSIS — L500 Allergic urticaria: Secondary | ICD-10-CM

## 2013-03-06 DIAGNOSIS — J309 Allergic rhinitis, unspecified: Secondary | ICD-10-CM

## 2013-03-06 NOTE — Patient Instructions (Addendum)
Ok to continue allergy vaccine, and to continue building the separate Cat/ Dog vaccine  I don't believe the sore shoulder is from your shots. Try rotating where the shots are given and use local heat if needed

## 2013-03-06 NOTE — Assessment & Plan Note (Signed)
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  Xolair may occasionally be an option if needed.

## 2013-03-06 NOTE — Progress Notes (Signed)
Patient ID: Jose Mahoney, male    DOB: May 14, 1966, 47 y.o.   MRN: [id number]  HPI [name] former smoker,  followed for chronic insomnia, allergic rhinitis. Last here in October 01, 2010 for allergy testing and started on allergy vaccine. Now building here- at 1:500. We discussed the buildup process, concomittant antihistamines, and anaphyllaxis.  He tried singulair and found in retrospect he was breathing easier in his chest, especially with yard work or Educational psychologistanimal exposure.  Temazepam 15 mg does help sleep, used 1-2x/ week. He feels it helps and is sufficient for his needs for now.  04/02/11- [name] former smoker,  followed for chronic insomnia, allergic rhinitis. Slowly building allergy vaccine now at 1:50, has had some local reactions. He reports fairly smooth Spring season this year- no significant problems. Holding at 0.3 of 1:50. Had continued twice weekly. Still taking Zyrtec.  Temazepam works well for intermittent chronic insomnia, used 2-3 x/ week.   10/07/11- [name] former smoker,  followed for chronic insomnia, allergic rhinitis, here in October 01, 2010 for allergy testing and started on allergy vaccine. Vaccine injection record reviewed. Acute visit complaining of recent onset sore throat with postnasal drainage some cough with light yellow sputum, no fever. Did get flu vaccine. Has been getting allergy vaccine without problems. We discussed pending spring pollen season but agreed the current illness has been viral.  04/05/12- [name] former smoker,  followed for chronic insomnia, allergic rhinitis, here in October 01, 2010 for allergy testing and started on allergy vaccine. Pt states nasal drainage still the same. pt denies sore throat. pt still getting allergy injections once weekly-with mild reactions Vaccine is at 1:10 GH. We discussed recent mild local reactions on 0.2604ml. Usually feels congested nose with breakthrough drip. Using Nasalcrom occasionally. Uses Zyrtec or  Benadryl. Insomnia adequately managed with temazepam used a couple of nights per week/well tolerated.  08/22/12- [name] former smoker,  followed for chronic insomnia, allergic rhinitis, here in October 01, 2010 for allergy testing and started on allergy vaccine. ACUTE VISIT: new dog for about a month now-noticed rash on hands-unknown cause Pruritus comes and goes. He shows faint rash fading on the back of his left hand. He has an arc-shaped linear red rash on his right thumb. He has been treating with topical Benadryl cream and taking Zyrtec daily. He has cats and has had a new dog for 5 weeks.  10/11/12- [name] former smoker,  followed for chronic insomnia, allergic rhinitis,urticaria- here in October 01, 2010 for allergy testing and started on allergy vaccine. follows for: pt states stil breaking out in hives,not sure if this is caused by flea an tick oil for dog. has .some sob,wheezing,.states that allergy injections 1:10 GH seems to be helping rinitis.  Got a new dog. 2 months later onset of isolated urticaria on hands. Started new flea and tick medication 3 weeks before onset of hives.  01/10/13- [name] former smoker,  followed for chronic insomnia, allergic rhinitis,urticaria FOLLOWS FOR: still on vaccine; review allergy lab work with patient. Still having break outs of hives. Lives w/ girlfriend and together they have a lot of animals. Has had urticaria 3 out of 4 weeks, including onset while out of town, while mowing yard.  Daily Zyrtec 2 in AM, 2in PM suggested by dermatologist. Also occ benadryl. Notes wheeze if skips singulair. Allergy Profile 10/11/12 Total IgE 1044, High for cat and for dust/ mite, grasses, trees, weeds.  Continues allergy vaccine 1:10 here.  Mix w/o cat/dog.   03/06/13- [name] former smoker,  followed for chronic insomnia, allergic rhinitis,urticaria FOLLOWS FOR: pt denies any concerns at this time-- has not had any hives since last visit-- cont to hold on xolair We  started parallel vaccine build with cat/ dog- progressing w/o problem ans he continues 1:10 regular vaccine.  Only a single hive since last here, taking Zyrtec and Zantac daily. R shoulder pain incidental arthralgia, not related to allergy injection.   ROS-see HPI Constitutional:   No-   weight loss, night sweats, fevers, chills, fatigue, lassitude. HEENT:   No-  headaches, difficulty swallowing, tooth/dental problems,  sore throat,       No-  sneezing, itching, ear ache, No-nasal congestion, post nasal drip,  CV:  No-   chest pain, orthopnea, PND, swelling in lower extremities, anasarca, dizziness, palpitations Resp: No-   shortness of breath with exertion or at rest.              No-   productive cough,  No non-productive cough,  No- coughing up of blood.      No-   change in color of mucus.  No-occasional wheezing.   Skin: + recurrent hives GI:  No-   heartburn, indigestion, abdominal pain, nausea, vomiting,  GU:  MS:  No-   joint pain or swelling.   Neuro-     nothing unusual Psych:  No- change in mood or affect. No depression or anxiety.  No memory loss.  Objective:   Physical Exam General- Alert, Oriented, Affect-appropriate, Distress- none acute, fit-appearing/ stockey Skin- ,excoriation- none, No rash demonstrated at this visit.  Lymphadenopathy- none Head- atraumatic            Eyes- Gross vision intact, PERRLA, conjunctivae clear secretions            Ears- Hearing, canals            Nose- Clear, No-Septal dev, mucus, polyps, erosion, perforation. nasal mucosa looks normal Throat- Mallampati III , mucosa red , drainage- none, tonsils- atrophic Neck- flexible , trachea midline, no stridor , thyroid nl, carotid no bruit Chest - symmetrical excursion , unlabored           Heart/CV- RRR , no murmur , no gallop  , no rub, nl s1 s2                           - JVD- none , edema- none, stasis changes- none, varices- none           Lung- clear to P&A, wheeze- none, cough- none ,  dullness-none, rub- none           Chest wall-  Abd-  Br/ Gen/ Rectal- Not done, not indicated Extrem- cyanosis- none, clubbing, none, atrophy- none, strength- nl Neuro- grossly intact to observation

## 2013-03-06 NOTE — Assessment & Plan Note (Signed)
Continue vaccine build for cat/dog. We can continue regular vaccine at 1:10.

## 2013-03-08 ENCOUNTER — Ambulatory Visit (INDEPENDENT_AMBULATORY_CARE_PROVIDER_SITE_OTHER): Payer: BC Managed Care – PPO

## 2013-03-08 DIAGNOSIS — J309 Allergic rhinitis, unspecified: Secondary | ICD-10-CM

## 2013-03-15 ENCOUNTER — Ambulatory Visit (INDEPENDENT_AMBULATORY_CARE_PROVIDER_SITE_OTHER): Payer: BC Managed Care – PPO

## 2013-03-15 DIAGNOSIS — J309 Allergic rhinitis, unspecified: Secondary | ICD-10-CM

## 2013-03-22 ENCOUNTER — Ambulatory Visit (INDEPENDENT_AMBULATORY_CARE_PROVIDER_SITE_OTHER): Payer: BC Managed Care – PPO

## 2013-03-22 DIAGNOSIS — J309 Allergic rhinitis, unspecified: Secondary | ICD-10-CM

## 2013-03-29 ENCOUNTER — Ambulatory Visit: Payer: BC Managed Care – PPO

## 2013-03-30 ENCOUNTER — Ambulatory Visit (INDEPENDENT_AMBULATORY_CARE_PROVIDER_SITE_OTHER): Payer: BC Managed Care – PPO

## 2013-03-30 DIAGNOSIS — J309 Allergic rhinitis, unspecified: Secondary | ICD-10-CM

## 2013-04-05 ENCOUNTER — Ambulatory Visit (INDEPENDENT_AMBULATORY_CARE_PROVIDER_SITE_OTHER): Payer: BC Managed Care – PPO

## 2013-04-05 DIAGNOSIS — J309 Allergic rhinitis, unspecified: Secondary | ICD-10-CM

## 2013-04-12 ENCOUNTER — Ambulatory Visit (INDEPENDENT_AMBULATORY_CARE_PROVIDER_SITE_OTHER): Payer: BC Managed Care – PPO

## 2013-04-12 DIAGNOSIS — J309 Allergic rhinitis, unspecified: Secondary | ICD-10-CM

## 2013-04-13 ENCOUNTER — Encounter: Payer: Self-pay | Admitting: Internal Medicine

## 2013-04-13 ENCOUNTER — Ambulatory Visit (INDEPENDENT_AMBULATORY_CARE_PROVIDER_SITE_OTHER): Payer: BC Managed Care – PPO | Admitting: Internal Medicine

## 2013-04-13 VITALS — BP 136/82 | HR 72 | Temp 98.2°F | Resp 16 | Wt 219.0 lb

## 2013-04-13 DIAGNOSIS — M25519 Pain in unspecified shoulder: Secondary | ICD-10-CM

## 2013-04-13 DIAGNOSIS — M25511 Pain in right shoulder: Secondary | ICD-10-CM

## 2013-04-13 DIAGNOSIS — L500 Allergic urticaria: Secondary | ICD-10-CM

## 2013-04-13 DIAGNOSIS — R5381 Other malaise: Secondary | ICD-10-CM

## 2013-04-13 DIAGNOSIS — R5383 Other fatigue: Secondary | ICD-10-CM

## 2013-04-13 DIAGNOSIS — Z0000 Encounter for general adult medical examination without abnormal findings: Secondary | ICD-10-CM

## 2013-04-13 DIAGNOSIS — E291 Testicular hypofunction: Secondary | ICD-10-CM

## 2013-04-13 MED ORDER — NAPROXEN 500 MG PO TABS: 500.0000 mg | ORAL_TABLET | Freq: Two times a day (BID) | ORAL | Status: DC

## 2013-04-13 NOTE — Assessment & Plan Note (Signed)
Labs

## 2013-04-13 NOTE — Assessment & Plan Note (Signed)
Gluten free trial (no wheat products) x4-6 weeks. OK to use Gluten-free bread and pasta. Milk free trial (no milk, ice cream and yogurt) x4 weeks. OK to use almond or soy milk. 

## 2013-04-13 NOTE — Progress Notes (Signed)
  Subjective:    Patient ID: Jose Mahoney, male    DOB: Oct 23, 1965, 47 y.o.   MRN: [id number]  Shoulder Injury  Pertinent negatives include no chest pain.    C/o R shoulder pain C/o fatigue, wt gain, ?low testosterone  Review of Systems  Constitutional: Negative for appetite change, fatigue and unexpected weight change.  HENT: Positive for congestion. Negative for nosebleeds, sore throat, sneezing, mouth sores, trouble swallowing, neck pain and voice change.   Eyes: Negative for itching and visual disturbance.  Respiratory: Positive for wheezing. Negative for cough.   Cardiovascular: Negative for chest pain, palpitations and leg swelling.  Gastrointestinal: Negative for nausea, diarrhea, blood in stool and abdominal distention.  Genitourinary: Negative for frequency and hematuria.  Musculoskeletal: Positive for arthralgias. Negative for back pain, joint swelling and gait problem.  Skin: Positive for rash. Negative for wound.  Neurological: Negative for dizziness, tremors, speech difficulty and weakness.  Psychiatric/Behavioral: Negative for suicidal ideas, sleep disturbance, dysphoric mood and agitation. The patient is not nervous/anxious.        Objective:   Physical Exam  Constitutional: He is oriented to person, place, and time. He appears well-developed. No distress.  NAD Obese  HENT:  Mouth/Throat: Oropharynx is clear and moist.  Eyes: Conjunctivae are normal. Pupils are equal, round, and reactive to light.  Neck: Normal range of motion. No JVD present. No thyromegaly present.  Cardiovascular: Normal rate, regular rhythm, normal heart sounds and intact distal pulses.  Exam reveals no gallop and no friction rub.   No murmur heard. Pulmonary/Chest: Effort normal and breath sounds normal. No respiratory distress. He has no wheezes. He has no rales. He exhibits no tenderness.  Abdominal: Soft. Bowel sounds are normal. He exhibits no distension and no mass. There is no  tenderness. There is no rebound and no guarding.  Musculoskeletal: Normal range of motion. He exhibits tenderness. He exhibits no edema.  R shoulder is tender  Lymphadenopathy:    He has no cervical adenopathy.  Neurological: He is alert and oriented to person, place, and time. He has normal reflexes. No cranial nerve deficit. He exhibits normal muscle tone. He displays a negative Romberg sign. Coordination and gait normal.  No meningeal signs  Skin: Skin is warm and dry. No rash noted. No erythema.  Psychiatric: He has a normal mood and affect. His behavior is normal. Judgment and thought content normal.          Assessment & Plan:

## 2013-04-13 NOTE — Patient Instructions (Signed)
Gluten free trial (no wheat products) x4-6 weeks. OK to use Gluten-free bread and pasta. Milk free trial (no milk, ice cream and yogurt) x4 weeks. OK to use almond or soy milk. 

## 2013-04-13 NOTE — Assessment & Plan Note (Signed)
Naproxen  ROM exercises

## 2013-04-19 ENCOUNTER — Ambulatory Visit (INDEPENDENT_AMBULATORY_CARE_PROVIDER_SITE_OTHER): Payer: BC Managed Care – PPO

## 2013-04-19 DIAGNOSIS — J309 Allergic rhinitis, unspecified: Secondary | ICD-10-CM

## 2013-04-25 ENCOUNTER — Ambulatory Visit (INDEPENDENT_AMBULATORY_CARE_PROVIDER_SITE_OTHER): Payer: BC Managed Care – PPO

## 2013-04-25 DIAGNOSIS — J309 Allergic rhinitis, unspecified: Secondary | ICD-10-CM

## 2013-04-26 ENCOUNTER — Ambulatory Visit: Payer: BC Managed Care – PPO

## 2013-05-02 ENCOUNTER — Ambulatory Visit: Payer: BC Managed Care – PPO

## 2013-05-04 ENCOUNTER — Ambulatory Visit (INDEPENDENT_AMBULATORY_CARE_PROVIDER_SITE_OTHER): Payer: BC Managed Care – PPO

## 2013-05-04 DIAGNOSIS — J309 Allergic rhinitis, unspecified: Secondary | ICD-10-CM

## 2013-05-10 ENCOUNTER — Ambulatory Visit: Payer: BC Managed Care – PPO

## 2013-05-11 ENCOUNTER — Ambulatory Visit (INDEPENDENT_AMBULATORY_CARE_PROVIDER_SITE_OTHER): Payer: BC Managed Care – PPO

## 2013-05-11 DIAGNOSIS — J309 Allergic rhinitis, unspecified: Secondary | ICD-10-CM

## 2013-05-12 ENCOUNTER — Ambulatory Visit (INDEPENDENT_AMBULATORY_CARE_PROVIDER_SITE_OTHER): Payer: BC Managed Care – PPO

## 2013-05-12 DIAGNOSIS — J309 Allergic rhinitis, unspecified: Secondary | ICD-10-CM

## 2013-05-17 ENCOUNTER — Ambulatory Visit (INDEPENDENT_AMBULATORY_CARE_PROVIDER_SITE_OTHER): Payer: BC Managed Care – PPO

## 2013-05-17 DIAGNOSIS — J309 Allergic rhinitis, unspecified: Secondary | ICD-10-CM

## 2013-05-18 ENCOUNTER — Ambulatory Visit: Payer: BC Managed Care – PPO

## 2013-05-22 ENCOUNTER — Ambulatory Visit (INDEPENDENT_AMBULATORY_CARE_PROVIDER_SITE_OTHER): Payer: BC Managed Care – PPO

## 2013-05-22 DIAGNOSIS — Z23 Encounter for immunization: Secondary | ICD-10-CM

## 2013-05-23 ENCOUNTER — Other Ambulatory Visit (INDEPENDENT_AMBULATORY_CARE_PROVIDER_SITE_OTHER): Payer: BC Managed Care – PPO

## 2013-05-23 DIAGNOSIS — L500 Allergic urticaria: Secondary | ICD-10-CM

## 2013-05-23 DIAGNOSIS — M25519 Pain in unspecified shoulder: Secondary | ICD-10-CM

## 2013-05-23 DIAGNOSIS — M25511 Pain in right shoulder: Secondary | ICD-10-CM

## 2013-05-23 DIAGNOSIS — R5381 Other malaise: Secondary | ICD-10-CM

## 2013-05-23 DIAGNOSIS — E291 Testicular hypofunction: Secondary | ICD-10-CM

## 2013-05-23 DIAGNOSIS — Z0000 Encounter for general adult medical examination without abnormal findings: Secondary | ICD-10-CM

## 2013-05-23 DIAGNOSIS — R7989 Other specified abnormal findings of blood chemistry: Secondary | ICD-10-CM

## 2013-05-23 LAB — HEPATIC FUNCTION PANEL
Albumin: 4.1 g/dL (ref 3.5–5.2)
Bilirubin, Direct: 0.1 mg/dL (ref 0.0–0.3)
Total Protein: 6.7 g/dL (ref 6.0–8.3)

## 2013-05-23 LAB — BASIC METABOLIC PANEL
Calcium: 9.1 mg/dL (ref 8.4–10.5)
GFR: 68.34 mL/min (ref >60.00)
Glucose, Bld: 95 mg/dL (ref 70–99)
Potassium: 4.4 mEq/L (ref 3.5–5.1)
Sodium: 140 mEq/L (ref 135–145)

## 2013-05-23 LAB — URINALYSIS
Hgb urine dipstick: NEGATIVE
Ketones, ur: NEGATIVE
Total Protein, Urine: NEGATIVE
Urine Glucose: NEGATIVE
Urobilinogen, UA: 0.2 (ref 0.0–1.0)
pH: 6.5 (ref 5.0–8.0)

## 2013-05-23 LAB — LIPID PANEL
HDL: 42.2 mg/dL (ref >39.00)
Total CHOL/HDL Ratio: 6
VLDL: 53.2 mg/dL — ABNORMAL HIGH (ref 0.0–40.0)

## 2013-05-23 LAB — CBC WITH DIFFERENTIAL/PLATELET
Basophils Absolute: 0 10*3/uL (ref 0.0–0.1)
Eosinophils Relative: 7.9 % — ABNORMAL HIGH (ref 0.0–5.0)
HCT: 44.6 % (ref 39.0–52.0)
Hemoglobin: 15.6 g/dL (ref 13.0–17.0)
Lymphocytes Relative: 29.1 % (ref 12.0–46.0)
Lymphs Abs: 1.6 10*3/uL (ref 0.7–4.0)
Monocytes Relative: 14.6 % — ABNORMAL HIGH (ref 3.0–12.0)
Neutro Abs: 2.6 10*3/uL (ref 1.4–7.7)
RBC: 4.63 Mil/uL (ref 4.22–5.81)
RDW: 11.8 % (ref 11.5–14.6)
WBC: 5.4 10*3/uL (ref 4.5–10.5)

## 2013-05-23 LAB — LDL CHOLESTEROL, DIRECT: Direct LDL: 167.3 mg/dL

## 2013-05-23 LAB — TSH: TSH: 1.1 u[IU]/mL (ref 0.35–5.50)

## 2013-05-24 ENCOUNTER — Ambulatory Visit (INDEPENDENT_AMBULATORY_CARE_PROVIDER_SITE_OTHER): Payer: BC Managed Care – PPO

## 2013-05-24 DIAGNOSIS — J309 Allergic rhinitis, unspecified: Secondary | ICD-10-CM

## 2013-05-24 LAB — TESTOSTERONE, FREE, TOTAL, SHBG
Testosterone, Free: 66.8 pg/mL (ref 47.0–244.0)
Testosterone: 276 ng/dL — ABNORMAL LOW (ref 300–890)

## 2013-05-25 ENCOUNTER — Ambulatory Visit: Payer: BC Managed Care – PPO

## 2013-05-29 ENCOUNTER — Encounter: Payer: Self-pay | Admitting: Internal Medicine

## 2013-05-29 ENCOUNTER — Ambulatory Visit (INDEPENDENT_AMBULATORY_CARE_PROVIDER_SITE_OTHER): Payer: BC Managed Care – PPO | Admitting: Internal Medicine

## 2013-05-29 VITALS — BP 130/80 | HR 74 | Temp 97.7°F | Wt 223.0 lb

## 2013-05-29 DIAGNOSIS — M25519 Pain in unspecified shoulder: Secondary | ICD-10-CM

## 2013-05-29 DIAGNOSIS — E291 Testicular hypofunction: Secondary | ICD-10-CM

## 2013-05-29 DIAGNOSIS — M25511 Pain in right shoulder: Secondary | ICD-10-CM

## 2013-05-29 NOTE — Patient Instructions (Signed)
Gluten free trial (no wheat products) for 4-6 weeks. OK to use gluten-free bread and gluten-free pasta.  Milk free trial (no milk, ice cream, cheese and yogurt) for 4-6 weeks. OK to use almond, coconut, rice or soy milk. "Almond breeze" brand tastes good.  There are natural ways to boost your testosterone:  1. Lose Weight If you're overweight, shedding the excess pounds may increase your testosterone levels, according to multiple research. Overweight men are more likely to have low testosterone levels to begin with, so this is an important trick to increase your body's testosterone production when you need it most.   2. Strength Training    Strength training is also known to boost testosterone levels, provided you are doing so intensely enough. When strength training to boost testosterone, you'll want to increase the weight and lower your number of reps, and then focus on exercises that work a large number of muscles.  3. Optimize Your Vitamin D Levels Vitamin D, a steroid hormone, is essential for the healthy development of the nucleus of the sperm cell, and helps maintain semen quality and sperm count. Vitamin D also increases levels of testosterone, which may boost libido. In one study, overweight men who were given vitamin D supplements had a significant increase in testosterone levels after one year.  4. Reduce Stress When you're under a lot of stress, your body releases high levels of the stress hormone cortisol. This hormone actually blocks the effects of testosterone, presumably because, from a biological standpoint, testosterone-associated behaviors (mating, competing, aggression) may have lowered your chances of survival in an emergency (hence, the "fight or flight" response is dominant, courtesy of cortisol).  5. Limit or Eliminate Sugar from Your Diet Testosterone levels decrease after you eat sugar, which is likely because the sugar leads to a high insulin level, another factor  leading to low testosterone.  6. Eat Healthy Fats By healthy, this means not only mon- and polyunsaturated fats, like that found in avocadoes and nuts, but also saturated, as these are essential for building testosterone. Research shows that a diet with less than 40 percent of energy as fat (and that mainly from animal sources, i.e. saturated) lead to a decrease in testosterone levels.  It's important to understand that your body requires saturated fats from animal and vegetable sources (such as meat, dairy, certain oils, and tropical plants like coconut) for optimal functioning, and if you neglect this important food group in favor of sugar, grains and other starchy carbs, your health and weight are almost guaranteed to suffer. Examples of healthy fats you can eat more of to give your testosterone levels a boost include:  Olives and Olive oil  Coconuts and coconut oil Butter made from organic milk  Raw nuts, such as, almonds or pecans Eggs Avocados   Meats Palm oil Unheated organic nut oils   7. "Testosterone boosters" containing Vitamin D-3, Niacin, Vitamin B-6, Vitamin B-12, Magnesium, Zinc, Selenium, D-Aspartic Acid, Fenugreed Seed Extract, Oystershell, [place] Extract, [place] Ginseng may be helpful as well.

## 2013-05-29 NOTE — Assessment & Plan Note (Signed)
85% better  

## 2013-05-29 NOTE — Assessment & Plan Note (Signed)
Discussed.

## 2013-05-29 NOTE — Progress Notes (Signed)
   Subjective:    HPI  F/u R shoulder pain 85 % better F/u fatigue, wt gain, ?low testosterone  Wt Readings from Last 3 Encounters:  05/29/13 223 lb (101.152 kg)  04/13/13 219 lb (99.338 kg)  03/06/13 214 lb 6.4 oz (97.251 kg)   BP Readings from Last 3 Encounters:  05/29/13 130/80  04/13/13 136/82  03/06/13 100/70      Review of Systems  Constitutional: Negative for appetite change, fatigue and unexpected weight change.  HENT: Positive for congestion. Negative for nosebleeds, sore throat, sneezing, mouth sores, trouble swallowing, neck pain and voice change.   Eyes: Negative for itching and visual disturbance.  Respiratory: Positive for wheezing. Negative for cough.   Cardiovascular: Negative for palpitations and leg swelling.  Gastrointestinal: Negative for nausea, diarrhea, blood in stool and abdominal distention.  Genitourinary: Negative for frequency and hematuria.  Musculoskeletal: Positive for arthralgias. Negative for back pain, joint swelling and gait problem.  Skin: Positive for rash. Negative for wound.  Neurological: Negative for dizziness, tremors, speech difficulty and weakness.  Psychiatric/Behavioral: Negative for suicidal ideas, sleep disturbance, dysphoric mood and agitation. The patient is not nervous/anxious.        Objective:   Physical Exam  Constitutional: He is oriented to person, place, and time. He appears well-developed. No distress.  NAD Obese  HENT:  Mouth/Throat: Oropharynx is clear and moist.  Eyes: Conjunctivae are normal. Pupils are equal, round, and reactive to light.  Neck: Normal range of motion. No JVD present. No thyromegaly present.  Cardiovascular: Normal rate, regular rhythm, normal heart sounds and intact distal pulses.  Exam reveals no gallop and no friction rub.   No murmur heard. Pulmonary/Chest: Effort normal and breath sounds normal. No respiratory distress. He has no wheezes. He has no rales. He exhibits no tenderness.   Abdominal: Soft. Bowel sounds are normal. He exhibits no distension and no mass. There is no tenderness. There is no rebound and no guarding.  Musculoskeletal: Normal range of motion. He exhibits tenderness. He exhibits no edema.  R shoulder is tender  Lymphadenopathy:    He has no cervical adenopathy.  Neurological: He is alert and oriented to person, place, and time. He has normal reflexes. No cranial nerve deficit. He exhibits normal muscle tone. He displays a negative Romberg sign. Coordination and gait normal.  No meningeal signs  Skin: Skin is warm and dry. No rash noted. No erythema.  Psychiatric: He has a normal mood and affect. His behavior is normal. Judgment and thought content normal.    Lab Results  Component Value Date   WBC 5.4 05/23/2013   HGB 15.6 05/23/2013   HCT 44.6 05/23/2013   PLT 190.0 05/23/2013   GLUCOSE 95 05/23/2013   CHOL 247* 05/23/2013   TRIG 266.0* 05/23/2013   HDL 42.20 05/23/2013   LDLDIRECT 167.3 05/23/2013   LDLCALC 111* 10/15/2010   ALT 35 05/23/2013   AST 28 05/23/2013   NA 140 05/23/2013   K 4.4 05/23/2013   CL 102 05/23/2013   CREATININE 1.2 05/23/2013   BUN 16 05/23/2013   CO2 30 05/23/2013   TSH 1.10 05/23/2013   PSA 0.46 05/23/2013         Assessment & Plan:

## 2013-05-31 ENCOUNTER — Ambulatory Visit (INDEPENDENT_AMBULATORY_CARE_PROVIDER_SITE_OTHER): Payer: BC Managed Care – PPO

## 2013-05-31 DIAGNOSIS — J309 Allergic rhinitis, unspecified: Secondary | ICD-10-CM

## 2013-06-07 ENCOUNTER — Ambulatory Visit (INDEPENDENT_AMBULATORY_CARE_PROVIDER_SITE_OTHER): Payer: BC Managed Care – PPO

## 2013-06-07 DIAGNOSIS — J309 Allergic rhinitis, unspecified: Secondary | ICD-10-CM

## 2013-06-14 ENCOUNTER — Ambulatory Visit (INDEPENDENT_AMBULATORY_CARE_PROVIDER_SITE_OTHER): Payer: BC Managed Care – PPO

## 2013-06-14 DIAGNOSIS — J309 Allergic rhinitis, unspecified: Secondary | ICD-10-CM

## 2013-06-20 ENCOUNTER — Ambulatory Visit (INDEPENDENT_AMBULATORY_CARE_PROVIDER_SITE_OTHER): Payer: BC Managed Care – PPO

## 2013-06-20 DIAGNOSIS — J309 Allergic rhinitis, unspecified: Secondary | ICD-10-CM

## 2013-06-21 ENCOUNTER — Ambulatory Visit: Payer: BC Managed Care – PPO

## 2013-06-27 ENCOUNTER — Ambulatory Visit (INDEPENDENT_AMBULATORY_CARE_PROVIDER_SITE_OTHER): Payer: BC Managed Care – PPO

## 2013-06-27 DIAGNOSIS — J309 Allergic rhinitis, unspecified: Secondary | ICD-10-CM

## 2013-06-28 ENCOUNTER — Ambulatory Visit: Payer: BC Managed Care – PPO | Admitting: Family Medicine

## 2013-07-04 ENCOUNTER — Ambulatory Visit (INDEPENDENT_AMBULATORY_CARE_PROVIDER_SITE_OTHER): Payer: BC Managed Care – PPO

## 2013-07-04 DIAGNOSIS — J309 Allergic rhinitis, unspecified: Secondary | ICD-10-CM

## 2013-07-05 ENCOUNTER — Ambulatory Visit: Payer: BC Managed Care – PPO

## 2013-07-11 ENCOUNTER — Ambulatory Visit: Payer: BC Managed Care – PPO

## 2013-07-13 ENCOUNTER — Ambulatory Visit (INDEPENDENT_AMBULATORY_CARE_PROVIDER_SITE_OTHER): Payer: BC Managed Care – PPO

## 2013-07-13 DIAGNOSIS — J309 Allergic rhinitis, unspecified: Secondary | ICD-10-CM

## 2013-07-18 ENCOUNTER — Ambulatory Visit (INDEPENDENT_AMBULATORY_CARE_PROVIDER_SITE_OTHER): Payer: BC Managed Care – PPO

## 2013-07-18 DIAGNOSIS — J309 Allergic rhinitis, unspecified: Secondary | ICD-10-CM

## 2013-07-25 ENCOUNTER — Ambulatory Visit (INDEPENDENT_AMBULATORY_CARE_PROVIDER_SITE_OTHER): Payer: BC Managed Care – PPO

## 2013-07-25 DIAGNOSIS — J309 Allergic rhinitis, unspecified: Secondary | ICD-10-CM

## 2013-08-01 ENCOUNTER — Ambulatory Visit (INDEPENDENT_AMBULATORY_CARE_PROVIDER_SITE_OTHER): Payer: BC Managed Care – PPO

## 2013-08-01 DIAGNOSIS — J309 Allergic rhinitis, unspecified: Secondary | ICD-10-CM

## 2013-08-08 ENCOUNTER — Ambulatory Visit (INDEPENDENT_AMBULATORY_CARE_PROVIDER_SITE_OTHER): Payer: BC Managed Care – PPO

## 2013-08-08 DIAGNOSIS — J309 Allergic rhinitis, unspecified: Secondary | ICD-10-CM

## 2013-08-09 ENCOUNTER — Ambulatory Visit (INDEPENDENT_AMBULATORY_CARE_PROVIDER_SITE_OTHER): Payer: BC Managed Care – PPO

## 2013-08-09 DIAGNOSIS — J309 Allergic rhinitis, unspecified: Secondary | ICD-10-CM

## 2013-08-15 ENCOUNTER — Ambulatory Visit (INDEPENDENT_AMBULATORY_CARE_PROVIDER_SITE_OTHER): Payer: BC Managed Care – PPO | Admitting: Family Medicine

## 2013-08-15 ENCOUNTER — Ambulatory Visit (INDEPENDENT_AMBULATORY_CARE_PROVIDER_SITE_OTHER): Payer: BC Managed Care – PPO

## 2013-08-15 ENCOUNTER — Encounter: Payer: Self-pay | Admitting: Family Medicine

## 2013-08-15 VITALS — BP 112/70 | HR 65 | Wt 228.0 lb

## 2013-08-15 DIAGNOSIS — J309 Allergic rhinitis, unspecified: Secondary | ICD-10-CM

## 2013-08-15 DIAGNOSIS — M545 Low back pain, unspecified: Secondary | ICD-10-CM

## 2013-08-15 MED ORDER — KETOROLAC TROMETHAMINE 60 MG/2ML IM SOLN
60.0000 mg | Freq: Once | INTRAMUSCULAR | Status: AC
  Administered 2013-08-15: 60 mg via INTRAMUSCULAR

## 2013-08-15 MED ORDER — METHYLPREDNISOLONE ACETATE 80 MG/ML IJ SUSP
80.0000 mg | Freq: Once | INTRAMUSCULAR | Status: AC
  Administered 2013-08-15: 80 mg via INTRAMUSCULAR

## 2013-08-15 MED ORDER — TRAMADOL HCL 50 MG PO TABS: 50.0000 mg | ORAL_TABLET | Freq: Every evening | ORAL | Status: DC | PRN

## 2013-08-15 MED ORDER — MELOXICAM 15 MG PO TABS: 15.0000 mg | ORAL_TABLET | Freq: Every day | ORAL | Status: DC

## 2013-08-15 NOTE — Progress Notes (Signed)
  I'm seeing this patient by the request  of:  Sonda Primes, MD  CC: Acute back pain  HPI: Patient is a very pleasant 47 year old gentleman with no significant past medical history coming in with acute low back pain x48 hours. Patient states that he was attempting do some exercises which she has not done a long time and while doing some lunges had a twinge in his lower back. Patient states that now when he woke up he had diffuse pain of the lumbar spine bilaterally with no radiculopathy down the legs. Patient states that the pain seems more of a dull ache cramping sensation. Patient states that movement seems to hurt more. He should has tried some anti-inflammatories with moderate improvement. Denies any bowel or bladder incontinence, denies any abdominal pain, denies any nausea or vomiting. Patient rates the pain approximately 8/10.   Past medical, surgical, family and social history reviewed. Medications reviewed all in the electronic medical record.   Review of Systems: No headache, visual changes, nausea, vomiting, diarrhea, constipation, dizziness, abdominal pain, skin rash, fevers, chills, night sweats, weight loss, swollen lymph nodes, body aches, joint swelling, muscle aches, chest pain, shortness of breath, mood changes.   Objective:    Blood pressure 112/70, pulse 65, weight 228 lb (103.42 kg), SpO2 98.00%.   General: No apparent distress alert and oriented x3 mood and affect normal, dressed appropriately.  Obese HEENT: Pupils equal, extraocular movements intact Respiratory: Patient's speak in full sentences and does not appear short of breath Cardiovascular: No lower extremity edema, non tender, no erythema Skin: Warm dry intact with no signs of infection or rash on extremities or on axial skeleton. Abdomen: Soft nontender Neuro: Cranial nerves II through XII are intact, neurovascularly intact in all extremities with 2+ DTRs and 2+ pulses. Lymph: No lymphadenopathy of posterior or  anterior cervical chain or axillae bilaterally.  Gait normal with good balance and coordination.  MSK: Non tender with full range of motion and good stability and symmetric strength and tone of shoulders, elbows, wrist, hip, knee and ankles bilaterally.  Back Exam:  Inspection: Unremarkable  Motion: Flexion 35 deg, Extension 25 deg, Side Bending to 45 deg bilaterally,  Rotation to 35 deg bilaterally  SLR laying: Negative  XSLR laying: Negative  Palpable tenderness: Diffuse tenderness of the lumbar spine bilaterally no spinous process tenderness. FABER: negative. Sensory change: Gross sensation intact to all lumbar and sacral dermatomes.  Reflexes: 2+ at both patellar tendons, 2+ at achilles tendons, Babinski's downgoing.  Strength at foot  Plantar-flexion: 5/5 Dorsi-flexion: 5/5 Eversion: 5/5 Inversion: 5/5  Leg strength  Quad: 5/5 Hamstring: 5/5 Hip flexor: 5/5 Hip abductors: 5/5  Gait unremarkable.   Impression and Recommendations:     This case required medical decision making of moderate complexity.

## 2013-08-15 NOTE — Patient Instructions (Signed)
Very nice to meet you Getup from desk every hour and walk around.  Meloixcam daily for 10 days then as needed Tramadol at night as needed.  Ice 20 minutes 2 times a day for next 2 days then heat.  Exercises I am giving you to start in 48 hours If not completely better in 1 week come on back.

## 2013-08-15 NOTE — Progress Notes (Signed)
Pre-visit discussion using our clinic review tool. No additional management support is needed unless otherwise documented below in the visit note.  

## 2013-08-15 NOTE — Assessment & Plan Note (Signed)
Appears to be more of an acute muscle spasm. Patient will try meloxicam daily for 10 days then as needed, tramadol as needed, home exercises to start in 48 hours. Discussed icing for the first 48 hours to eat thereafter. Patient return in one week if not completely resolved we will consider getting further imaging as well as potentially manipulation therapy. Otherwise patient to followup on an as-needed basis. We did discuss proper lifting technique.

## 2013-08-16 ENCOUNTER — Ambulatory Visit: Payer: BC Managed Care – PPO

## 2013-08-22 ENCOUNTER — Ambulatory Visit (INDEPENDENT_AMBULATORY_CARE_PROVIDER_SITE_OTHER): Payer: BC Managed Care – PPO

## 2013-08-22 DIAGNOSIS — J309 Allergic rhinitis, unspecified: Secondary | ICD-10-CM

## 2013-08-23 ENCOUNTER — Ambulatory Visit: Payer: BC Managed Care – PPO

## 2013-08-29 ENCOUNTER — Ambulatory Visit (INDEPENDENT_AMBULATORY_CARE_PROVIDER_SITE_OTHER): Payer: BC Managed Care – PPO | Admitting: Internal Medicine

## 2013-08-29 ENCOUNTER — Encounter: Payer: Self-pay | Admitting: Internal Medicine

## 2013-08-29 VITALS — BP 110/80 | HR 80 | Temp 96.9°F | Resp 16 | Wt 236.0 lb

## 2013-08-29 DIAGNOSIS — Z23 Encounter for immunization: Secondary | ICD-10-CM

## 2013-08-29 DIAGNOSIS — N529 Male erectile dysfunction, unspecified: Secondary | ICD-10-CM

## 2013-08-29 DIAGNOSIS — L500 Allergic urticaria: Secondary | ICD-10-CM

## 2013-08-29 DIAGNOSIS — E291 Testicular hypofunction: Secondary | ICD-10-CM

## 2013-08-29 MED ORDER — ASPIRIN 81 MG PO CHEW: 81.0000 mg | CHEWABLE_TABLET | Freq: Every day | ORAL | Status: DC

## 2013-08-29 MED ORDER — TADALAFIL 20 MG PO TABS: 20.0000 mg | ORAL_TABLET | Freq: Every day | ORAL | Status: DC | PRN

## 2013-08-29 NOTE — Progress Notes (Signed)
Pre visit review using our clinic review tool, if applicable. No additional management support is needed unless otherwise documented below in the visit note. 

## 2013-08-29 NOTE — Assessment & Plan Note (Signed)
Wt loss and exercise

## 2013-08-29 NOTE — Progress Notes (Signed)
   Subjective:    HPI  F/u R shoulder pain 99 % better F/u fatigue, wt gain, ?low testosterone  Wt Readings from Last 3 Encounters:  08/29/13 236 lb (107.049 kg)  08/15/13 228 lb (103.42 kg)  05/29/13 223 lb (101.152 kg)   BP Readings from Last 3 Encounters:  08/29/13 110/80  08/15/13 112/70  05/29/13 130/80      Review of Systems  Constitutional: Negative for appetite change, fatigue and unexpected weight change.  HENT: Positive for congestion. Negative for mouth sores, nosebleeds, sneezing, sore throat, trouble swallowing and voice change.   Eyes: Negative for itching and visual disturbance.  Respiratory: Positive for wheezing. Negative for cough.   Cardiovascular: Negative for palpitations and leg swelling.  Gastrointestinal: Negative for nausea, diarrhea, blood in stool and abdominal distention.  Genitourinary: Negative for frequency and hematuria.  Musculoskeletal: Positive for arthralgias. Negative for back pain, gait problem, joint swelling and neck pain.  Skin: Positive for rash. Negative for wound.  Neurological: Negative for dizziness, tremors, speech difficulty and weakness.  Psychiatric/Behavioral: Negative for suicidal ideas, sleep disturbance, dysphoric mood and agitation. The patient is not nervous/anxious.        Objective:   Physical Exam  Constitutional: He is oriented to person, place, and time. He appears well-developed. No distress.  NAD Obese  HENT:  Mouth/Throat: Oropharynx is clear and moist.  Eyes: Conjunctivae are normal. Pupils are equal, round, and reactive to light.  Neck: Normal range of motion. No JVD present. No thyromegaly present.  Cardiovascular: Normal rate, regular rhythm, normal heart sounds and intact distal pulses.  Exam reveals no gallop and no friction rub.   No murmur heard. Pulmonary/Chest: Effort normal and breath sounds normal. No respiratory distress. He has no wheezes. He has no rales. He exhibits no tenderness.   Abdominal: Soft. Bowel sounds are normal. He exhibits no distension and no mass. There is no tenderness. There is no rebound and no guarding.  Musculoskeletal: Normal range of motion. He exhibits tenderness. He exhibits no edema.  R shoulder is tender  Lymphadenopathy:    He has no cervical adenopathy.  Neurological: He is alert and oriented to person, place, and time. He has normal reflexes. No cranial nerve deficit. He exhibits normal muscle tone. He displays a negative Romberg sign. Coordination and gait normal.  No meningeal signs  Skin: Skin is warm and dry. No rash noted. No erythema.  Psychiatric: He has a normal mood and affect. His behavior is normal. Judgment and thought content normal.    Lab Results  Component Value Date   WBC 5.4 05/23/2013   HGB 15.6 05/23/2013   HCT 44.6 05/23/2013   PLT 190.0 05/23/2013   GLUCOSE 95 05/23/2013   CHOL 247* 05/23/2013   TRIG 266.0* 05/23/2013   HDL 42.20 05/23/2013   LDLDIRECT 167.3 05/23/2013   LDLCALC 111* 10/15/2010   ALT 35 05/23/2013   AST 28 05/23/2013   NA 140 05/23/2013   K 4.4 05/23/2013   CL 102 05/23/2013   CREATININE 1.2 05/23/2013   BUN 16 05/23/2013   CO2 30 05/23/2013   TSH 1.10 05/23/2013   PSA 0.46 05/23/2013         Assessment & Plan:

## 2013-08-29 NOTE — Patient Instructions (Signed)
Gluten free trial (no wheat products) for 4-6 weeks. OK to use gluten-free bread and gluten-free pasta.  Milk free trial (no milk, ice cream, cheese and yogurt) for 4-6 weeks. OK to use almond, coconut, rice milk. "Almond breeze" brand tastes good.   There are natural ways to boost your testosterone:  1. Lose Weight If you're overweight, shedding the excess pounds may increase your testosterone levels, according to multiple research. Overweight men are more likely to have low testosterone levels to begin with, so this is an important trick to increase your body's testosterone production when you need it most.   2. Strength Training    Strength training is also known to boost testosterone levels, provided you are doing so intensely enough. When strength training to boost testosterone, you'll want to increase the weight and lower your number of reps, and then focus on exercises that work a large number of muscles.  3. Optimize Your Vitamin D Levels Vitamin D, a steroid hormone, is essential for the healthy development of the nucleus of the sperm cell, and helps maintain semen quality and sperm count. Vitamin D also increases levels of testosterone, which may boost libido. In one study, overweight men who were given vitamin D supplements had a significant increase in testosterone levels after one year.  4. Reduce Stress When you're under a lot of stress, your body releases high levels of the stress hormone cortisol. This hormone actually blocks the effects of testosterone, presumably because, from a biological standpoint, testosterone-associated behaviors (mating, competing, aggression) may have lowered your chances of survival in an emergency (hence, the "fight or flight" response is dominant, courtesy of cortisol).  5. Limit or Eliminate Sugar from Your Diet Testosterone levels decrease after you eat sugar, which is likely because the sugar leads to a high insulin level, another factor leading to  low testosterone.  6. Eat Healthy Fats By healthy, this means not only mon- and polyunsaturated fats, like that found in avocadoes and nuts, but also saturated, as these are essential for building testosterone. Research shows that a diet with less than 40 percent of energy as fat (and that mainly from animal sources, i.e. saturated) lead to a decrease in testosterone levels.  It's important to understand that your body requires saturated fats from animal and vegetable sources (such as meat, dairy, certain oils, and tropical plants like coconut) for optimal functioning, and if you neglect this important food group in favor of sugar, grains and other starchy carbs, your health and weight are almost guaranteed to suffer. Examples of healthy fats you can eat more of to give your testosterone levels a boost include:  Olives and Olive oil  Coconuts and coconut oil Butter made from organic milk  Raw nuts, such as, almonds or pecans Eggs Avocados   Meats Palm oil Unheated organic nut oils   7. "Testosterone boosters" containing Vitamin D-3, Niacin, Vitamin B-6, Vitamin B-12, Magnesium, Zinc, Selenium, D-Aspartic Acid, Fenugreed Seed Extract, Oystershell, Suma Extract, Siberian Ginseng may be helpful as well.    

## 2013-08-30 ENCOUNTER — Other Ambulatory Visit: Payer: Self-pay | Admitting: *Deleted

## 2013-08-30 NOTE — Telephone Encounter (Signed)
Ok ?email or print Thx

## 2013-08-30 NOTE — Telephone Encounter (Signed)
Pt called states his insurance will cover 12ct Cialis for 90 days.  Pt is requesting new Rx for 12ct.  Please advise

## 2013-08-31 DIAGNOSIS — N529 Male erectile dysfunction, unspecified: Secondary | ICD-10-CM | POA: Insufficient documentation

## 2013-08-31 NOTE — Assessment & Plan Note (Signed)
Cialis prn Low testosterone discussed

## 2013-08-31 NOTE — Assessment & Plan Note (Signed)
Better  

## 2013-09-01 ENCOUNTER — Encounter: Payer: Self-pay | Admitting: Internal Medicine

## 2013-09-01 ENCOUNTER — Ambulatory Visit (INDEPENDENT_AMBULATORY_CARE_PROVIDER_SITE_OTHER): Payer: BC Managed Care – PPO

## 2013-09-01 DIAGNOSIS — J309 Allergic rhinitis, unspecified: Secondary | ICD-10-CM

## 2013-09-01 MED ORDER — TADALAFIL 20 MG PO TABS: 20.0000 mg | ORAL_TABLET | Freq: Every day | ORAL | Status: DC | PRN

## 2013-09-01 NOTE — Addendum Note (Signed)
Addended by: Cristela BluePULLIAM, Iosefa Weintraub W on: 09/01/2013 04:01 PM   Modules accepted: Orders

## 2013-09-05 ENCOUNTER — Ambulatory Visit (INDEPENDENT_AMBULATORY_CARE_PROVIDER_SITE_OTHER): Payer: No Typology Code available for payment source

## 2013-09-05 ENCOUNTER — Encounter: Payer: Self-pay | Admitting: Internal Medicine

## 2013-09-05 ENCOUNTER — Ambulatory Visit (INDEPENDENT_AMBULATORY_CARE_PROVIDER_SITE_OTHER): Payer: BC Managed Care – PPO | Admitting: Internal Medicine

## 2013-09-05 ENCOUNTER — Other Ambulatory Visit: Payer: No Typology Code available for payment source

## 2013-09-05 VITALS — BP 118/62 | HR 6 | Ht 69.5 in | Wt 234.6 lb

## 2013-09-05 DIAGNOSIS — L509 Urticaria, unspecified: Secondary | ICD-10-CM

## 2013-09-05 DIAGNOSIS — J309 Allergic rhinitis, unspecified: Secondary | ICD-10-CM

## 2013-09-05 DIAGNOSIS — J3089 Other allergic rhinitis: Secondary | ICD-10-CM

## 2013-09-05 DIAGNOSIS — L500 Allergic urticaria: Secondary | ICD-10-CM

## 2013-09-05 DIAGNOSIS — J302 Other seasonal allergic rhinitis: Secondary | ICD-10-CM

## 2013-09-05 NOTE — Progress Notes (Signed)
Patient ID: Jose Mahoney, male    DOB: May 14, 1966, 48 y.o.   MRN: [id number]  HPI [name] former smoker,  followed for chronic insomnia, allergic rhinitis. Last here in October 01, 2010 for allergy testing and started on allergy vaccine. Now building here- at 1:500. We discussed the buildup process, concomittant antihistamines, and anaphyllaxis.  He tried singulair and found in retrospect he was breathing easier in his chest, especially with yard work or Educational psychologistanimal exposure.  Temazepam 15 mg does help sleep, used 1-2x/ week. He feels it helps and is sufficient for his needs for now.  04/02/11- [name] former smoker,  followed for chronic insomnia, allergic rhinitis. Slowly building allergy vaccine now at 1:50, has had some local reactions. He reports fairly smooth Spring season this year- no significant problems. Holding at 0.3 of 1:50. Had continued twice weekly. Still taking Zyrtec.  Temazepam works well for intermittent chronic insomnia, used 2-3 x/ week.   10/07/11- [name] former smoker,  followed for chronic insomnia, allergic rhinitis, here in October 01, 2010 for allergy testing and started on allergy vaccine. Vaccine injection record reviewed. Acute visit complaining of recent onset sore throat with postnasal drainage some cough with light yellow sputum, no fever. Did get flu vaccine. Has been getting allergy vaccine without problems. We discussed pending spring pollen season but agreed the current illness has been viral.  04/05/12- [name] former smoker,  followed for chronic insomnia, allergic rhinitis, here in October 01, 2010 for allergy testing and started on allergy vaccine. Pt states nasal drainage still the same. pt denies sore throat. pt still getting allergy injections once weekly-with mild reactions Vaccine is at 1:10 GH. We discussed recent mild local reactions on 0.2604ml. Usually feels congested nose with breakthrough drip. Using Nasalcrom occasionally. Uses Zyrtec or  Benadryl. Insomnia adequately managed with temazepam used a couple of nights per week/well tolerated.  08/22/12- [name] former smoker,  followed for chronic insomnia, allergic rhinitis, here in October 01, 2010 for allergy testing and started on allergy vaccine. ACUTE VISIT: new dog for about a month now-noticed rash on hands-unknown cause Pruritus comes and goes. He shows faint rash fading on the back of his left hand. He has an arc-shaped linear red rash on his right thumb. He has been treating with topical Benadryl cream and taking Zyrtec daily. He has cats and has had a new dog for 5 weeks.  10/11/12- [name] former smoker,  followed for chronic insomnia, allergic rhinitis,urticaria- here in October 01, 2010 for allergy testing and started on allergy vaccine. follows for: pt states stil breaking out in hives,not sure if this is caused by flea an tick oil for dog. has .some sob,wheezing,.states that allergy injections 1:10 GH seems to be helping rinitis.  Got a new dog. 2 months later onset of isolated urticaria on hands. Started new flea and tick medication 3 weeks before onset of hives.  01/10/13- [name] former smoker,  followed for chronic insomnia, allergic rhinitis,urticaria FOLLOWS FOR: still on vaccine; review allergy lab work with patient. Still having break outs of hives. Lives w/ girlfriend and together they have a lot of animals. Has had urticaria 3 out of 4 weeks, including onset while out of town, while mowing yard.  Daily Zyrtec 2 in AM, 2in PM suggested by dermatologist. Also occ benadryl. Notes wheeze if skips singulair. Allergy Profile 10/11/12 Total IgE 1044, High for cat and for dust/ mite, grasses, trees, weeds.  Continues allergy vaccine 1:10 here.  Mix w/o cat/dog.   03/06/13- [name] former smoker,  followed for chronic insomnia, allergic rhinitis,urticaria FOLLOWS FOR: pt denies any concerns at this time-- has not had any hives since last visit-- cont to hold on xolair We  started parallel vaccine build with cat/ dog- progressing w/o problem ans he continues 1:10 regular vaccine.  Only a single hive since last here, taking Zyrtec and Zantac daily. R shoulder pain incidental arthralgia, not related to allergy injection.   09/05/13- [name] former smoker,  followed for chronic insomnia, allergic rhinitis,urticaria FOLLOWS FOR: still on vaccine 1:10 GH; has not had any flare ups of hives in past 6 months-got back on Zantac Still building cat allergen as a separate vial now at 1:500. Urticaria on hand his prevented by both Zyrtec plus Zantac. No longer having significant attacks. Asks about potential benefit from a restrictive diet, excluding gluten, etc- discussed. Sleeping better, off temazepam but using an herbal product instead.  ROS-see HPI Constitutional:   No-   weight loss, night sweats, fevers, chills, fatigue, lassitude. HEENT:   No-  headaches, difficulty swallowing, tooth/dental problems,  sore throat,       No-  sneezing, itching, ear ache, No-nasal congestion, post nasal drip,  CV:  No-   chest pain, orthopnea, PND, swelling in lower extremities, anasarca, dizziness, palpitations Resp: No-   shortness of breath with exertion or at rest.              No-   productive cough,  No non-productive cough,  No- coughing up of blood.      No-   change in color of mucus.  No-occasional wheezing.   Skin: HPI GI:  No-   heartburn, indigestion, abdominal pain, nausea, vomiting,  GU:  MS:  No-   joint pain or swelling.   Neuro-     nothing unusual Psych:  No- change in mood or affect. No depression or anxiety.  No memory loss.  Objective:   Physical Exam General- Alert, Oriented, Affect-appropriate, Distress- none acute, fit-appearing/ stockey Skin- ,excoriation- none, No rash demonstrated at this visit.  Lymphadenopathy- none Head- atraumatic            Eyes- Gross vision intact, PERRLA, conjunctivae clear secretions            Ears- Hearing, canals             Nose- Clear, No-Septal dev, mucus, polyps, erosion, perforation. nasal mucosa looks                  normal Throat- Mallampati III , mucosa red , drainage- none, tonsils- atrophic Neck- flexible , trachea midline, no stridor , thyroid nl, carotid no bruit Chest - symmetrical excursion , unlabored           Heart/CV- RRR , no murmur , no gallop  , no rub, nl s1 s2                           - JVD- none , edema- none, stasis changes- none, varices- none           Lung- clear to P&A, wheeze- none, cough- none , dullness-none, rub- none           Chest wall-  Abd-  Br/ Gen/ Rectal- Not done, not indicated Extrem- cyanosis- none, clubbing, none, atrophy- none, strength- nl Neuro- grossly intact to observation

## 2013-09-05 NOTE — Patient Instructions (Signed)
We can continue allergy vaccine  Order- lab- Food IgE profile     Dx urticaria                    Celiac panel  Ok to try an elimination diet to see what seems to make a difference

## 2013-09-06 ENCOUNTER — Ambulatory Visit: Payer: BC Managed Care – PPO

## 2013-09-06 LAB — ALLERGEN FOOD PROFILE SPECIFIC IGE
Apple: 0.76 kU/L — ABNORMAL HIGH
Chicken IgE: 0.13 kU/L — ABNORMAL HIGH
Corn: 0.46 kU/L — ABNORMAL HIGH
EGG WHITE IGE: 0.84 kU/L — AB
Fish Cod: <0.1 kU/L
IgE (Immunoglobulin E), Serum: 719.9 IU/mL — ABNORMAL HIGH (ref 0.0–180.0)
Milk IgE: 0.35 kU/L — ABNORMAL HIGH
Orange: 0.42 kU/L — ABNORMAL HIGH
PEANUT IGE: 1.97 kU/L — AB
Shrimp IgE: 1.01 kU/L — ABNORMAL HIGH
Soybean IgE: 0.6 kU/L — ABNORMAL HIGH
TOMATO IGE: 2.36 kU/L — AB
WHEAT IGE: 1.25 kU/L — AB

## 2013-09-06 LAB — TISSUE TRANSGLUTAMINASE, IGA: Tissue Transglutaminase Ab, IgA: 2.9 U/mL (ref <20)

## 2013-09-06 LAB — GLIADIN ANTIBODIES, SERUM
GLIADIN IGA: 1 U/mL (ref <20)
Gliadin IgG: 118 U/mL — ABNORMAL HIGH (ref <20)

## 2013-09-08 LAB — RETICULIN ANTIBODIES, IGA W TITER: Reticulin Ab, IgA: NEGATIVE

## 2013-09-13 ENCOUNTER — Ambulatory Visit (INDEPENDENT_AMBULATORY_CARE_PROVIDER_SITE_OTHER): Payer: BC Managed Care – PPO

## 2013-09-13 DIAGNOSIS — J309 Allergic rhinitis, unspecified: Secondary | ICD-10-CM

## 2013-09-14 ENCOUNTER — Encounter: Payer: Self-pay | Admitting: Internal Medicine

## 2013-09-14 NOTE — Progress Notes (Signed)
Quick Note:  Spoke with patient; aware of results and sent to MyChart. ______

## 2013-09-20 ENCOUNTER — Ambulatory Visit (INDEPENDENT_AMBULATORY_CARE_PROVIDER_SITE_OTHER): Payer: BC Managed Care – PPO

## 2013-09-20 DIAGNOSIS — J309 Allergic rhinitis, unspecified: Secondary | ICD-10-CM

## 2013-09-27 ENCOUNTER — Ambulatory Visit (INDEPENDENT_AMBULATORY_CARE_PROVIDER_SITE_OTHER): Payer: BC Managed Care – PPO

## 2013-09-27 DIAGNOSIS — J309 Allergic rhinitis, unspecified: Secondary | ICD-10-CM

## 2013-09-28 NOTE — Assessment & Plan Note (Signed)
He questions role of food allergy/intolerance. Education done. Plan-food allergy profile, celiac panel

## 2013-09-28 NOTE — Assessment & Plan Note (Signed)
Okay to continue allergy vaccine. When cat antigen at 1:10 we can merge vials.

## 2013-10-03 ENCOUNTER — Ambulatory Visit (INDEPENDENT_AMBULATORY_CARE_PROVIDER_SITE_OTHER): Payer: BC Managed Care – PPO

## 2013-10-03 DIAGNOSIS — J309 Allergic rhinitis, unspecified: Secondary | ICD-10-CM

## 2013-10-11 ENCOUNTER — Ambulatory Visit (INDEPENDENT_AMBULATORY_CARE_PROVIDER_SITE_OTHER): Payer: BC Managed Care – PPO

## 2013-10-11 DIAGNOSIS — J309 Allergic rhinitis, unspecified: Secondary | ICD-10-CM

## 2013-10-12 ENCOUNTER — Other Ambulatory Visit: Payer: Self-pay | Admitting: Internal Medicine

## 2013-10-18 ENCOUNTER — Ambulatory Visit (INDEPENDENT_AMBULATORY_CARE_PROVIDER_SITE_OTHER): Payer: BC Managed Care – PPO

## 2013-10-18 DIAGNOSIS — J309 Allergic rhinitis, unspecified: Secondary | ICD-10-CM

## 2013-10-25 ENCOUNTER — Ambulatory Visit (INDEPENDENT_AMBULATORY_CARE_PROVIDER_SITE_OTHER): Payer: BC Managed Care – PPO

## 2013-10-25 DIAGNOSIS — J309 Allergic rhinitis, unspecified: Secondary | ICD-10-CM

## 2013-10-28 ENCOUNTER — Encounter: Payer: Self-pay | Admitting: Internal Medicine

## 2013-10-30 ENCOUNTER — Other Ambulatory Visit: Payer: Self-pay | Admitting: Internal Medicine

## 2013-10-30 ENCOUNTER — Other Ambulatory Visit: Payer: Self-pay | Admitting: *Deleted

## 2013-10-30 MED ORDER — TADALAFIL 20 MG PO TABS: 20.0000 mg | ORAL_TABLET | Freq: Every day | ORAL | Status: DC | PRN

## 2013-11-01 ENCOUNTER — Ambulatory Visit (INDEPENDENT_AMBULATORY_CARE_PROVIDER_SITE_OTHER): Payer: BC Managed Care – PPO

## 2013-11-01 DIAGNOSIS — J309 Allergic rhinitis, unspecified: Secondary | ICD-10-CM

## 2013-11-02 ENCOUNTER — Ambulatory Visit: Payer: BC Managed Care – PPO

## 2013-11-08 ENCOUNTER — Ambulatory Visit (INDEPENDENT_AMBULATORY_CARE_PROVIDER_SITE_OTHER): Payer: BC Managed Care – PPO

## 2013-11-08 DIAGNOSIS — J309 Allergic rhinitis, unspecified: Secondary | ICD-10-CM

## 2013-11-09 ENCOUNTER — Ambulatory Visit (INDEPENDENT_AMBULATORY_CARE_PROVIDER_SITE_OTHER): Payer: BC Managed Care – PPO

## 2013-11-09 DIAGNOSIS — J309 Allergic rhinitis, unspecified: Secondary | ICD-10-CM

## 2013-11-15 ENCOUNTER — Ambulatory Visit (INDEPENDENT_AMBULATORY_CARE_PROVIDER_SITE_OTHER): Payer: BC Managed Care – PPO

## 2013-11-15 DIAGNOSIS — J309 Allergic rhinitis, unspecified: Secondary | ICD-10-CM

## 2013-11-22 ENCOUNTER — Ambulatory Visit (INDEPENDENT_AMBULATORY_CARE_PROVIDER_SITE_OTHER): Payer: BC Managed Care – PPO

## 2013-11-22 DIAGNOSIS — J309 Allergic rhinitis, unspecified: Secondary | ICD-10-CM

## 2013-11-24 ENCOUNTER — Encounter: Payer: Self-pay | Admitting: Internal Medicine

## 2013-11-29 ENCOUNTER — Ambulatory Visit (INDEPENDENT_AMBULATORY_CARE_PROVIDER_SITE_OTHER): Payer: BC Managed Care – PPO

## 2013-11-29 DIAGNOSIS — J309 Allergic rhinitis, unspecified: Secondary | ICD-10-CM

## 2013-12-06 ENCOUNTER — Ambulatory Visit (INDEPENDENT_AMBULATORY_CARE_PROVIDER_SITE_OTHER): Payer: BC Managed Care – PPO

## 2013-12-06 DIAGNOSIS — J309 Allergic rhinitis, unspecified: Secondary | ICD-10-CM

## 2013-12-13 ENCOUNTER — Ambulatory Visit (INDEPENDENT_AMBULATORY_CARE_PROVIDER_SITE_OTHER): Payer: BC Managed Care – PPO

## 2013-12-13 DIAGNOSIS — J309 Allergic rhinitis, unspecified: Secondary | ICD-10-CM

## 2013-12-20 ENCOUNTER — Ambulatory Visit: Payer: BC Managed Care – PPO

## 2013-12-21 ENCOUNTER — Ambulatory Visit (INDEPENDENT_AMBULATORY_CARE_PROVIDER_SITE_OTHER): Payer: BC Managed Care – PPO

## 2013-12-21 DIAGNOSIS — J309 Allergic rhinitis, unspecified: Secondary | ICD-10-CM

## 2013-12-27 ENCOUNTER — Ambulatory Visit (INDEPENDENT_AMBULATORY_CARE_PROVIDER_SITE_OTHER): Payer: BC Managed Care – PPO

## 2013-12-27 DIAGNOSIS — J309 Allergic rhinitis, unspecified: Secondary | ICD-10-CM

## 2014-01-03 ENCOUNTER — Ambulatory Visit (INDEPENDENT_AMBULATORY_CARE_PROVIDER_SITE_OTHER): Payer: BC Managed Care – PPO

## 2014-01-03 DIAGNOSIS — J309 Allergic rhinitis, unspecified: Secondary | ICD-10-CM

## 2014-01-10 ENCOUNTER — Ambulatory Visit (INDEPENDENT_AMBULATORY_CARE_PROVIDER_SITE_OTHER): Payer: BC Managed Care – PPO

## 2014-01-10 DIAGNOSIS — J309 Allergic rhinitis, unspecified: Secondary | ICD-10-CM

## 2014-01-17 ENCOUNTER — Ambulatory Visit (INDEPENDENT_AMBULATORY_CARE_PROVIDER_SITE_OTHER): Payer: BC Managed Care – PPO

## 2014-01-17 DIAGNOSIS — J309 Allergic rhinitis, unspecified: Secondary | ICD-10-CM

## 2014-01-24 ENCOUNTER — Ambulatory Visit (INDEPENDENT_AMBULATORY_CARE_PROVIDER_SITE_OTHER): Payer: BC Managed Care – PPO

## 2014-01-24 DIAGNOSIS — J309 Allergic rhinitis, unspecified: Secondary | ICD-10-CM

## 2014-01-30 ENCOUNTER — Encounter: Payer: Self-pay | Admitting: Internal Medicine

## 2014-01-31 ENCOUNTER — Ambulatory Visit (INDEPENDENT_AMBULATORY_CARE_PROVIDER_SITE_OTHER): Payer: BC Managed Care – PPO

## 2014-01-31 DIAGNOSIS — J309 Allergic rhinitis, unspecified: Secondary | ICD-10-CM

## 2014-02-07 ENCOUNTER — Ambulatory Visit (INDEPENDENT_AMBULATORY_CARE_PROVIDER_SITE_OTHER): Payer: BC Managed Care – PPO

## 2014-02-07 DIAGNOSIS — J309 Allergic rhinitis, unspecified: Secondary | ICD-10-CM

## 2014-02-14 ENCOUNTER — Ambulatory Visit: Payer: BC Managed Care – PPO

## 2014-02-20 ENCOUNTER — Ambulatory Visit: Payer: BC Managed Care – PPO | Admitting: Internal Medicine

## 2014-02-21 ENCOUNTER — Ambulatory Visit (INDEPENDENT_AMBULATORY_CARE_PROVIDER_SITE_OTHER): Payer: BC Managed Care – PPO

## 2014-02-21 DIAGNOSIS — J309 Allergic rhinitis, unspecified: Secondary | ICD-10-CM

## 2014-02-22 ENCOUNTER — Ambulatory Visit (INDEPENDENT_AMBULATORY_CARE_PROVIDER_SITE_OTHER): Payer: BC Managed Care – PPO

## 2014-02-22 DIAGNOSIS — J309 Allergic rhinitis, unspecified: Secondary | ICD-10-CM

## 2014-02-28 ENCOUNTER — Ambulatory Visit (INDEPENDENT_AMBULATORY_CARE_PROVIDER_SITE_OTHER): Payer: BC Managed Care – PPO

## 2014-02-28 DIAGNOSIS — J309 Allergic rhinitis, unspecified: Secondary | ICD-10-CM

## 2014-03-07 ENCOUNTER — Ambulatory Visit (INDEPENDENT_AMBULATORY_CARE_PROVIDER_SITE_OTHER): Payer: BC Managed Care – PPO

## 2014-03-07 ENCOUNTER — Ambulatory Visit: Payer: BC Managed Care – PPO | Admitting: Internal Medicine

## 2014-03-07 DIAGNOSIS — J309 Allergic rhinitis, unspecified: Secondary | ICD-10-CM

## 2014-03-08 ENCOUNTER — Encounter: Payer: Self-pay | Admitting: Internal Medicine

## 2014-03-08 ENCOUNTER — Ambulatory Visit (INDEPENDENT_AMBULATORY_CARE_PROVIDER_SITE_OTHER): Payer: BC Managed Care – PPO | Admitting: Internal Medicine

## 2014-03-08 VITALS — BP 118/78 | HR 77 | Ht 69.5 in | Wt 239.0 lb

## 2014-03-08 DIAGNOSIS — L500 Allergic urticaria: Secondary | ICD-10-CM

## 2014-03-08 DIAGNOSIS — J3089 Other allergic rhinitis: Principal | ICD-10-CM

## 2014-03-08 DIAGNOSIS — J302 Other seasonal allergic rhinitis: Secondary | ICD-10-CM

## 2014-03-08 DIAGNOSIS — J309 Allergic rhinitis, unspecified: Secondary | ICD-10-CM

## 2014-03-08 DIAGNOSIS — J45909 Unspecified asthma, uncomplicated: Secondary | ICD-10-CM

## 2014-03-08 DIAGNOSIS — G4700 Insomnia, unspecified: Secondary | ICD-10-CM

## 2014-03-08 DIAGNOSIS — J45998 Other asthma: Secondary | ICD-10-CM

## 2014-03-08 MED ORDER — TEMAZEPAM 15 MG PO CAPS: 15.0000 mg | ORAL_CAPSULE | Freq: Every evening | ORAL | Status: DC | PRN

## 2014-03-08 MED ORDER — EPINEPHRINE 0.3 MG/0.3ML IJ SOAJ: INTRAMUSCULAR | Status: DC

## 2014-03-08 NOTE — Progress Notes (Signed)
Patient ID: Jose Mahoney, male    DOB: May 14, 1966, 48 y.o.   MRN: [id number]  HPI [name] former smoker,  followed for chronic insomnia, allergic rhinitis. Last here in October 01, 2010 for allergy testing and started on allergy vaccine. Now building here- at 1:500. We discussed the buildup process, concomittant antihistamines, and anaphyllaxis.  He tried singulair and found in retrospect he was breathing easier in his chest, especially with yard work or Educational psychologistanimal exposure.  Temazepam 15 mg does help sleep, used 1-2x/ week. He feels it helps and is sufficient for his needs for now.  04/02/11- [name] former smoker,  followed for chronic insomnia, allergic rhinitis. Slowly building allergy vaccine now at 1:50, has had some local reactions. He reports fairly smooth Spring season this year- no significant problems. Holding at 0.3 of 1:50. Had continued twice weekly. Still taking Zyrtec.  Temazepam works well for intermittent chronic insomnia, used 2-3 x/ week.   10/07/11- [name] former smoker,  followed for chronic insomnia, allergic rhinitis, here in October 01, 2010 for allergy testing and started on allergy vaccine. Vaccine injection record reviewed. Acute visit complaining of recent onset sore throat with postnasal drainage some cough with light yellow sputum, no fever. Did get flu vaccine. Has been getting allergy vaccine without problems. We discussed pending spring pollen season but agreed the current illness has been viral.  04/05/12- [name] former smoker,  followed for chronic insomnia, allergic rhinitis, here in October 01, 2010 for allergy testing and started on allergy vaccine. Pt states nasal drainage still the same. pt denies sore throat. pt still getting allergy injections once weekly-with mild reactions Vaccine is at 1:10 GH. We discussed recent mild local reactions on 0.2604ml. Usually feels congested nose with breakthrough drip. Using Nasalcrom occasionally. Uses Zyrtec or  Benadryl. Insomnia adequately managed with temazepam used a couple of nights per week/well tolerated.  08/22/12- [name] former smoker,  followed for chronic insomnia, allergic rhinitis, here in October 01, 2010 for allergy testing and started on allergy vaccine. ACUTE VISIT: new dog for about a month now-noticed rash on hands-unknown cause Pruritus comes and goes. He shows faint rash fading on the back of his left hand. He has an arc-shaped linear red rash on his right thumb. He has been treating with topical Benadryl cream and taking Zyrtec daily. He has cats and has had a new dog for 5 weeks.  10/11/12- [name] former smoker,  followed for chronic insomnia, allergic rhinitis,urticaria- here in October 01, 2010 for allergy testing and started on allergy vaccine. follows for: pt states stil breaking out in hives,not sure if this is caused by flea an tick oil for dog. has .some sob,wheezing,.states that allergy injections 1:10 GH seems to be helping rinitis.  Got a new dog. 2 months later onset of isolated urticaria on hands. Started new flea and tick medication 3 weeks before onset of hives.  01/10/13- [name] former smoker,  followed for chronic insomnia, allergic rhinitis,urticaria FOLLOWS FOR: still on vaccine; review allergy lab work with patient. Still having break outs of hives. Lives w/ girlfriend and together they have a lot of animals. Has had urticaria 3 out of 4 weeks, including onset while out of town, while mowing yard.  Daily Zyrtec 2 in AM, 2in PM suggested by dermatologist. Also occ benadryl. Notes wheeze if skips singulair. Allergy Profile 10/11/12 Total IgE 1044, High for cat and for dust/ mite, grasses, trees, weeds.  Continues allergy vaccine 1:10 here.  Mix w/o cat/dog.   03/06/13- [name] former smoker,  followed for chronic insomnia, allergic rhinitis,urticaria FOLLOWS FOR: pt denies any concerns at this time-- has not had any hives since last visit-- cont to hold on xolair We  started parallel vaccine build with cat/ dog- progressing w/o problem ans he continues 1:10 regular vaccine.  Only a single hive since last here, taking Zyrtec and Zantac daily. R shoulder pain incidental arthralgia, not related to allergy injection.   09/05/13- [name] former smoker,  followed for chronic insomnia, allergic rhinitis,urticaria FOLLOWS FOR: still on vaccine 1:10 GH; has not had any flare ups of hives in past 6 months-got back on Zantac Still building cat allergen as a separate vial now at 1:500. Urticaria on hand his prevented by both Zyrtec plus Zantac. No longer having significant attacks. Asks about potential benefit from a restrictive diet, excluding gluten, etc- discussed. Sleeping better, off temazepam but using an herbal product instead.  03/08/14- 47 yoM former smoker,  followed for chronic insomnia, allergic rhinitis,urticaria FOLLOWS FOR: still on Allergy vaccine 1:10 GH and doing well. No hives in a long time. Daily Zantac seems to help [facility] with allergy vaccine- had merged in cat antigen.  Insomnia- worried about job- company is moving.   ROS-see HPI Constitutional:   No-   weight loss, night sweats, fevers, chills, fatigue, lassitude. HEENT:   No-  headaches, difficulty swallowing, tooth/dental problems,  sore throat,       No-  sneezing, itching, ear ache, No-nasal congestion, post nasal drip,  CV:  No-   chest pain, orthopnea, PND, swelling in lower extremities, anasarca, dizziness, palpitations Resp: No-   shortness of breath with exertion or at rest.              No-   productive cough,  No non-productive cough,  No- coughing up of blood.      No-   change in color of mucus.  No-occasional wheezing.   Skin: HPI GI:  No-   heartburn, indigestion, abdominal pain, nausea, vomiting,  GU:  MS:  No-   joint pain or swelling.   Neuro-     nothing unusual Psych:  No- change in mood or affect. No depression or anxiety.  No memory loss.  Objective:   Physical  Exam General- Alert, Oriented, Affect-appropriate, Distress- none acute, fit-appearing/ stocky Skin- ,excoriation- none, No rash demonstrated at this visit.  Lymphadenopathy- none Head- atraumatic            Eyes- Gross vision intact, PERRLA, conjunctivae clear secretions            Ears- Hearing, canals            Nose- Clear, No-Septal dev, mucus, polyps, erosion, perforation. nasal mucosa looks                  normal Throat- Mallampati III , mucosa red , drainage- none, tonsils- atrophic Neck- flexible , trachea midline, no stridor , thyroid nl, carotid no bruit Chest - symmetrical excursion , unlabored           Heart/CV- RRR , no murmur , no gallop  , no rub, nl s1 s2                           - JVD- none , edema- none, stasis changes- none, varices- none           Lung- clear to P&A, wheeze- none,  cough- none , dullness-none, rub- none           Chest wall-  Abd-  Br/ Gen/ Rectal- Not done, not indicated Extrem- cyanosis- none, clubbing, none, atrophy- none, strength- nl Neuro- grossly intact to observation

## 2014-03-08 NOTE — Patient Instructions (Signed)
We can continue allergy vaccine 1:10 GH  Refill scripts printed for temazepam and Epipen  Call as needed if we can help, or for refills

## 2014-03-21 ENCOUNTER — Ambulatory Visit (INDEPENDENT_AMBULATORY_CARE_PROVIDER_SITE_OTHER): Payer: BC Managed Care – PPO

## 2014-03-21 DIAGNOSIS — J309 Allergic rhinitis, unspecified: Secondary | ICD-10-CM

## 2014-03-28 ENCOUNTER — Ambulatory Visit (INDEPENDENT_AMBULATORY_CARE_PROVIDER_SITE_OTHER): Payer: BC Managed Care – PPO

## 2014-03-28 DIAGNOSIS — J309 Allergic rhinitis, unspecified: Secondary | ICD-10-CM

## 2014-04-04 ENCOUNTER — Ambulatory Visit (INDEPENDENT_AMBULATORY_CARE_PROVIDER_SITE_OTHER): Payer: BC Managed Care – PPO

## 2014-04-04 DIAGNOSIS — J309 Allergic rhinitis, unspecified: Secondary | ICD-10-CM

## 2014-04-11 ENCOUNTER — Encounter: Payer: Self-pay | Admitting: Internal Medicine

## 2014-04-11 ENCOUNTER — Ambulatory Visit (INDEPENDENT_AMBULATORY_CARE_PROVIDER_SITE_OTHER): Payer: BC Managed Care – PPO

## 2014-04-11 DIAGNOSIS — J309 Allergic rhinitis, unspecified: Secondary | ICD-10-CM

## 2014-04-16 ENCOUNTER — Ambulatory Visit: Payer: BC Managed Care – PPO | Admitting: Internal Medicine

## 2014-04-18 ENCOUNTER — Ambulatory Visit (INDEPENDENT_AMBULATORY_CARE_PROVIDER_SITE_OTHER): Payer: BC Managed Care – PPO

## 2014-04-18 DIAGNOSIS — J309 Allergic rhinitis, unspecified: Secondary | ICD-10-CM

## 2014-04-25 ENCOUNTER — Ambulatory Visit (INDEPENDENT_AMBULATORY_CARE_PROVIDER_SITE_OTHER): Payer: BC Managed Care – PPO

## 2014-04-25 DIAGNOSIS — J309 Allergic rhinitis, unspecified: Secondary | ICD-10-CM

## 2014-05-02 ENCOUNTER — Ambulatory Visit (INDEPENDENT_AMBULATORY_CARE_PROVIDER_SITE_OTHER): Payer: BC Managed Care – PPO

## 2014-05-02 DIAGNOSIS — J309 Allergic rhinitis, unspecified: Secondary | ICD-10-CM

## 2014-05-03 ENCOUNTER — Ambulatory Visit (INDEPENDENT_AMBULATORY_CARE_PROVIDER_SITE_OTHER): Payer: BC Managed Care – PPO

## 2014-05-03 DIAGNOSIS — J309 Allergic rhinitis, unspecified: Secondary | ICD-10-CM

## 2014-05-08 ENCOUNTER — Other Ambulatory Visit: Payer: Self-pay | Admitting: Internal Medicine

## 2014-05-09 ENCOUNTER — Ambulatory Visit (INDEPENDENT_AMBULATORY_CARE_PROVIDER_SITE_OTHER): Payer: BC Managed Care – PPO

## 2014-05-09 DIAGNOSIS — J309 Allergic rhinitis, unspecified: Secondary | ICD-10-CM

## 2014-05-16 ENCOUNTER — Ambulatory Visit (INDEPENDENT_AMBULATORY_CARE_PROVIDER_SITE_OTHER): Payer: BC Managed Care – PPO

## 2014-05-16 DIAGNOSIS — J309 Allergic rhinitis, unspecified: Secondary | ICD-10-CM

## 2014-05-16 DIAGNOSIS — Z23 Encounter for immunization: Secondary | ICD-10-CM

## 2014-05-23 ENCOUNTER — Ambulatory Visit (INDEPENDENT_AMBULATORY_CARE_PROVIDER_SITE_OTHER): Payer: BC Managed Care – PPO

## 2014-05-23 DIAGNOSIS — J309 Allergic rhinitis, unspecified: Secondary | ICD-10-CM

## 2014-05-26 NOTE — Assessment & Plan Note (Signed)
Well controlled. We'll his allergy vaccine helpful and worth continuing.

## 2014-05-26 NOTE — Assessment & Plan Note (Signed)
Plan-temazepam discussed

## 2014-05-26 NOTE — Assessment & Plan Note (Signed)
Controlled with daily Zantac

## 2014-05-26 NOTE — Assessment & Plan Note (Signed)
Mild intermittent and well controlled, symptomatic only if she skips Singulair

## 2014-05-30 ENCOUNTER — Ambulatory Visit (INDEPENDENT_AMBULATORY_CARE_PROVIDER_SITE_OTHER): Payer: BC Managed Care – PPO

## 2014-05-30 DIAGNOSIS — J309 Allergic rhinitis, unspecified: Secondary | ICD-10-CM

## 2014-06-06 ENCOUNTER — Ambulatory Visit (INDEPENDENT_AMBULATORY_CARE_PROVIDER_SITE_OTHER): Payer: BC Managed Care – PPO

## 2014-06-06 DIAGNOSIS — J309 Allergic rhinitis, unspecified: Secondary | ICD-10-CM

## 2014-06-07 ENCOUNTER — Encounter: Payer: Self-pay | Admitting: Internal Medicine

## 2014-06-13 ENCOUNTER — Ambulatory Visit (INDEPENDENT_AMBULATORY_CARE_PROVIDER_SITE_OTHER): Payer: BC Managed Care – PPO

## 2014-06-13 DIAGNOSIS — J309 Allergic rhinitis, unspecified: Secondary | ICD-10-CM

## 2014-06-20 ENCOUNTER — Ambulatory Visit (INDEPENDENT_AMBULATORY_CARE_PROVIDER_SITE_OTHER): Payer: BC Managed Care – PPO

## 2014-06-20 DIAGNOSIS — J309 Allergic rhinitis, unspecified: Secondary | ICD-10-CM

## 2014-06-27 ENCOUNTER — Ambulatory Visit (INDEPENDENT_AMBULATORY_CARE_PROVIDER_SITE_OTHER): Payer: No Typology Code available for payment source

## 2014-06-27 DIAGNOSIS — J309 Allergic rhinitis, unspecified: Secondary | ICD-10-CM

## 2014-07-02 ENCOUNTER — Encounter: Payer: Self-pay | Admitting: Internal Medicine

## 2014-07-04 ENCOUNTER — Encounter: Payer: Self-pay | Admitting: Internal Medicine

## 2014-07-04 ENCOUNTER — Ambulatory Visit: Payer: BC Managed Care – PPO

## 2014-07-13 ENCOUNTER — Ambulatory Visit (INDEPENDENT_AMBULATORY_CARE_PROVIDER_SITE_OTHER): Payer: No Typology Code available for payment source

## 2014-07-13 DIAGNOSIS — J309 Allergic rhinitis, unspecified: Secondary | ICD-10-CM

## 2014-07-18 ENCOUNTER — Ambulatory Visit (INDEPENDENT_AMBULATORY_CARE_PROVIDER_SITE_OTHER): Payer: No Typology Code available for payment source

## 2014-07-18 DIAGNOSIS — J309 Allergic rhinitis, unspecified: Secondary | ICD-10-CM

## 2014-07-25 ENCOUNTER — Ambulatory Visit (INDEPENDENT_AMBULATORY_CARE_PROVIDER_SITE_OTHER): Payer: No Typology Code available for payment source

## 2014-07-25 DIAGNOSIS — J309 Allergic rhinitis, unspecified: Secondary | ICD-10-CM

## 2014-08-03 ENCOUNTER — Ambulatory Visit (INDEPENDENT_AMBULATORY_CARE_PROVIDER_SITE_OTHER): Payer: No Typology Code available for payment source

## 2014-08-03 DIAGNOSIS — J309 Allergic rhinitis, unspecified: Secondary | ICD-10-CM

## 2014-08-15 ENCOUNTER — Ambulatory Visit (INDEPENDENT_AMBULATORY_CARE_PROVIDER_SITE_OTHER): Payer: No Typology Code available for payment source

## 2014-08-15 DIAGNOSIS — J309 Allergic rhinitis, unspecified: Secondary | ICD-10-CM

## 2014-08-22 ENCOUNTER — Ambulatory Visit (INDEPENDENT_AMBULATORY_CARE_PROVIDER_SITE_OTHER): Payer: No Typology Code available for payment source

## 2014-08-22 DIAGNOSIS — J309 Allergic rhinitis, unspecified: Secondary | ICD-10-CM

## 2014-08-29 ENCOUNTER — Ambulatory Visit (INDEPENDENT_AMBULATORY_CARE_PROVIDER_SITE_OTHER): Payer: No Typology Code available for payment source

## 2014-08-29 DIAGNOSIS — J309 Allergic rhinitis, unspecified: Secondary | ICD-10-CM

## 2014-09-05 ENCOUNTER — Encounter: Payer: Self-pay | Admitting: Internal Medicine

## 2014-09-06 ENCOUNTER — Ambulatory Visit (INDEPENDENT_AMBULATORY_CARE_PROVIDER_SITE_OTHER): Payer: 59

## 2014-09-06 DIAGNOSIS — J309 Allergic rhinitis, unspecified: Secondary | ICD-10-CM

## 2014-09-12 ENCOUNTER — Ambulatory Visit: Payer: 59

## 2014-09-14 ENCOUNTER — Ambulatory Visit (INDEPENDENT_AMBULATORY_CARE_PROVIDER_SITE_OTHER): Payer: 59

## 2014-09-14 DIAGNOSIS — J309 Allergic rhinitis, unspecified: Secondary | ICD-10-CM

## 2014-09-24 ENCOUNTER — Ambulatory Visit (INDEPENDENT_AMBULATORY_CARE_PROVIDER_SITE_OTHER): Payer: 59

## 2014-09-24 DIAGNOSIS — J309 Allergic rhinitis, unspecified: Secondary | ICD-10-CM

## 2014-10-03 ENCOUNTER — Ambulatory Visit (INDEPENDENT_AMBULATORY_CARE_PROVIDER_SITE_OTHER): Payer: 59

## 2014-10-03 DIAGNOSIS — J309 Allergic rhinitis, unspecified: Secondary | ICD-10-CM

## 2014-10-10 ENCOUNTER — Ambulatory Visit (INDEPENDENT_AMBULATORY_CARE_PROVIDER_SITE_OTHER): Payer: 59

## 2014-10-10 DIAGNOSIS — J309 Allergic rhinitis, unspecified: Secondary | ICD-10-CM

## 2014-10-15 ENCOUNTER — Ambulatory Visit (INDEPENDENT_AMBULATORY_CARE_PROVIDER_SITE_OTHER): Payer: 59

## 2014-10-15 DIAGNOSIS — J309 Allergic rhinitis, unspecified: Secondary | ICD-10-CM

## 2014-10-25 ENCOUNTER — Ambulatory Visit (INDEPENDENT_AMBULATORY_CARE_PROVIDER_SITE_OTHER): Payer: 59

## 2014-10-25 DIAGNOSIS — J309 Allergic rhinitis, unspecified: Secondary | ICD-10-CM

## 2014-11-05 ENCOUNTER — Ambulatory Visit (INDEPENDENT_AMBULATORY_CARE_PROVIDER_SITE_OTHER): Payer: 59

## 2014-11-05 DIAGNOSIS — J309 Allergic rhinitis, unspecified: Secondary | ICD-10-CM

## 2014-11-09 ENCOUNTER — Encounter: Payer: Self-pay | Admitting: Internal Medicine

## 2014-11-14 ENCOUNTER — Ambulatory Visit: Payer: 59

## 2014-11-15 ENCOUNTER — Other Ambulatory Visit: Payer: Self-pay | Admitting: Internal Medicine

## 2014-11-15 NOTE — Telephone Encounter (Signed)
Last appt 03/08/14 F/u in 1 year #30/4 refill sent 9/15 #30/3 refill sent today

## 2014-11-16 ENCOUNTER — Ambulatory Visit (INDEPENDENT_AMBULATORY_CARE_PROVIDER_SITE_OTHER): Payer: 59

## 2014-11-16 DIAGNOSIS — J309 Allergic rhinitis, unspecified: Secondary | ICD-10-CM

## 2014-11-28 ENCOUNTER — Ambulatory Visit (INDEPENDENT_AMBULATORY_CARE_PROVIDER_SITE_OTHER): Payer: 59

## 2014-11-28 DIAGNOSIS — J309 Allergic rhinitis, unspecified: Secondary | ICD-10-CM

## 2014-12-10 ENCOUNTER — Ambulatory Visit (INDEPENDENT_AMBULATORY_CARE_PROVIDER_SITE_OTHER): Payer: 59

## 2014-12-10 DIAGNOSIS — J309 Allergic rhinitis, unspecified: Secondary | ICD-10-CM

## 2014-12-20 ENCOUNTER — Ambulatory Visit (INDEPENDENT_AMBULATORY_CARE_PROVIDER_SITE_OTHER): Payer: 59

## 2014-12-20 DIAGNOSIS — J309 Allergic rhinitis, unspecified: Secondary | ICD-10-CM | POA: Diagnosis not present

## 2015-01-01 ENCOUNTER — Ambulatory Visit (INDEPENDENT_AMBULATORY_CARE_PROVIDER_SITE_OTHER): Payer: 59

## 2015-01-01 DIAGNOSIS — J309 Allergic rhinitis, unspecified: Secondary | ICD-10-CM

## 2015-01-08 ENCOUNTER — Ambulatory Visit (INDEPENDENT_AMBULATORY_CARE_PROVIDER_SITE_OTHER): Payer: 59

## 2015-01-08 DIAGNOSIS — J309 Allergic rhinitis, unspecified: Secondary | ICD-10-CM

## 2015-01-10 ENCOUNTER — Encounter: Payer: Self-pay | Admitting: Internal Medicine

## 2015-01-10 ENCOUNTER — Ambulatory Visit (INDEPENDENT_AMBULATORY_CARE_PROVIDER_SITE_OTHER): Payer: 59 | Admitting: Internal Medicine

## 2015-01-10 VITALS — BP 114/72 | HR 57 | Ht 69.0 in | Wt 201.0 lb

## 2015-01-10 DIAGNOSIS — J309 Allergic rhinitis, unspecified: Secondary | ICD-10-CM | POA: Diagnosis not present

## 2015-01-10 DIAGNOSIS — G4700 Insomnia, unspecified: Secondary | ICD-10-CM | POA: Diagnosis not present

## 2015-01-10 DIAGNOSIS — L500 Allergic urticaria: Secondary | ICD-10-CM

## 2015-01-10 DIAGNOSIS — J3089 Other allergic rhinitis: Principal | ICD-10-CM

## 2015-01-10 DIAGNOSIS — J302 Other seasonal allergic rhinitis: Secondary | ICD-10-CM

## 2015-01-10 MED ORDER — MONTELUKAST SODIUM 10 MG PO TABS: 10.0000 mg | ORAL_TABLET | Freq: Every day | ORAL | Status: DC

## 2015-01-10 NOTE — Patient Instructions (Signed)
We can change your allergy vaccine schedule to every other week  The allergy lab can plan to build your Vial C with cat up to 1:10 and merge it with Vial B  Refill script sent for Singulair  Please call as needed

## 2015-01-10 NOTE — Progress Notes (Signed)
Patient ID: Jose Mahoney, male    DOB: May 14, 1966, 49 y.o.   MRN: [id number]  HPI [name] former smoker,  followed for chronic insomnia, allergic rhinitis. Last here in October 01, 2010 for allergy testing and started on allergy vaccine. Now building here- at 1:500. We discussed the buildup process, concomittant antihistamines, and anaphyllaxis.  He tried singulair and found in retrospect he was breathing easier in his chest, especially with yard work or Educational psychologistanimal exposure.  Temazepam 15 mg does help sleep, used 1-2x/ week. He feels it helps and is sufficient for his needs for now.  04/02/11- [name] former smoker,  followed for chronic insomnia, allergic rhinitis. Slowly building allergy vaccine now at 1:50, has had some local reactions. He reports fairly smooth Spring season this year- no significant problems. Holding at 0.3 of 1:50. Had continued twice weekly. Still taking Zyrtec.  Temazepam works well for intermittent chronic insomnia, used 2-3 x/ week.   10/07/11- [name] former smoker,  followed for chronic insomnia, allergic rhinitis, here in October 01, 2010 for allergy testing and started on allergy vaccine. Vaccine injection record reviewed. Acute visit complaining of recent onset sore throat with postnasal drainage some cough with light yellow sputum, no fever. Did get flu vaccine. Has been getting allergy vaccine without problems. We discussed pending spring pollen season but agreed the current illness has been viral.  04/05/12- [name] former smoker,  followed for chronic insomnia, allergic rhinitis, here in October 01, 2010 for allergy testing and started on allergy vaccine. Pt states nasal drainage still the same. pt denies sore throat. pt still getting allergy injections once weekly-with mild reactions Vaccine is at 1:10 GH. We discussed recent mild local reactions on 0.2604ml. Usually feels congested nose with breakthrough drip. Using Nasalcrom occasionally. Uses Zyrtec or  Benadryl. Insomnia adequately managed with temazepam used a couple of nights per week/well tolerated.  08/22/12- [name] former smoker,  followed for chronic insomnia, allergic rhinitis, here in October 01, 2010 for allergy testing and started on allergy vaccine. ACUTE VISIT: new dog for about a month now-noticed rash on hands-unknown cause Pruritus comes and goes. He shows faint rash fading on the back of his left hand. He has an arc-shaped linear red rash on his right thumb. He has been treating with topical Benadryl cream and taking Zyrtec daily. He has cats and has had a new dog for 5 weeks.  10/11/12- [name] former smoker,  followed for chronic insomnia, allergic rhinitis,urticaria- here in October 01, 2010 for allergy testing and started on allergy vaccine. follows for: pt states stil breaking out in hives,not sure if this is caused by flea an tick oil for dog. has .some sob,wheezing,.states that allergy injections 1:10 GH seems to be helping rinitis.  Got a new dog. 2 months later onset of isolated urticaria on hands. Started new flea and tick medication 3 weeks before onset of hives.  01/10/13- [name] former smoker,  followed for chronic insomnia, allergic rhinitis,urticaria FOLLOWS FOR: still on vaccine; review allergy lab work with patient. Still having break outs of hives. Lives w/ girlfriend and together they have a lot of animals. Has had urticaria 3 out of 4 weeks, including onset while out of town, while mowing yard.  Daily Zyrtec 2 in AM, 2in PM suggested by dermatologist. Also occ benadryl. Notes wheeze if skips singulair. Allergy Profile 10/11/12 Total IgE 1044, High for cat and for dust/ mite, grasses, trees, weeds.  Continues allergy vaccine 1:10 here.  Mix w/o cat/dog.   03/06/13- [name] former smoker,  followed for chronic insomnia, allergic rhinitis,urticaria FOLLOWS FOR: pt denies any concerns at this time-- has not had any hives since last visit-- cont to hold on xolair We  started parallel vaccine build with cat/ dog- progressing w/o problem ans he continues 1:10 regular vaccine.  Only a single hive since last here, taking Zyrtec and Zantac daily. R shoulder pain incidental arthralgia, not related to allergy injection.   09/05/13- [name] former smoker,  followed for chronic insomnia, allergic rhinitis,urticaria FOLLOWS FOR: still on vaccine 1:10 GH; has not had any flare ups of hives in past 6 months-got back on Zantac Still building cat allergen as a separate vial now at 1:500. Urticaria on hand his prevented by both Zyrtec plus Zantac. No longer having significant attacks. Asks about potential benefit from a restrictive diet, excluding gluten, etc- discussed. Sleeping better, off temazepam but using an herbal product instead.  03/08/14- 47 yoM former smoker,  followed for chronic insomnia, allergic rhinitis,urticaria FOLLOWS FOR: still on Allergy vaccine 1:10 GH and doing well. No hives in a long time. Daily [facility]- had merged in cat antigen.  Insomnia- worried about job- company is moving.   01/10/15- [age] yoM former smoker,  followed for chronic insomnia, allergic rhinitis,urticaria FOLLOWS FOR: still on Allergy vaccine 1:10 GH and doing well overall. Dieted off 40 lbs, avoiding dairy, gluten.  Fewer hives Less need for allergy meds, continues  Zyrtec Insomnia better with occasional temazepam still  ROS-see HPI Constitutional:   +  weight loss, no- night sweats, fevers, chills, fatigue, lassitude. HEENT:   No-  headaches, difficulty swallowing, tooth/dental problems,  sore throat,       No-  sneezing, itching, ear ache, No-nasal congestion, post nasal drip,  CV:  No-   chest pain, orthopnea, PND, swelling in lower extremities, anasarca, dizziness, palpitations Resp: No-   shortness of breath with exertion or at rest.              No-   productive cough,  No non-productive cough,  No- coughing up of blood.      No-    change in color of mucus.  No-occasional wheezing.   Skin: HPI GI:  No-   heartburn, indigestion, abdominal pain, nausea, vomiting,  GU:  MS:  No-   joint pain or swelling.   Neuro-     nothing unusual Psych:  No- change in mood or affect. No depression or anxiety.  No memory loss.  Objective:   Physical Exam General- Alert, Oriented, Affect-appropriate, Distress- none acute, fit-appearing/ stocky Skin- ,excoriation- none, No rash demonstrated at this visit.  Lymphadenopathy- none Head- atraumatic            Eyes- Gross vision intact, PERRLA, conjunctivae clear secretions            Ears- Hearing, canals            Nose- Clear, No-Septal dev, mucus, polyps, erosion, perforation. nasal mucosa looks   normal Throat- Mallampati III , mucosa clear , drainage- none, tonsils- atrophic Neck- flexible , trachea midline, no stridor , thyroid nl, carotid no bruit Chest - symmetrical excursion , unlabored           Heart/CV- RRR , no murmur , no gallop  , no rub, nl s1 s2                           -  JVD- none , edema- none, stasis changes- none, varices- none           Lung- clear to P&A, wheeze- none, cough- none , dullness-none, rub- none           Chest wall-  Abd-  Br/ Gen/ Rectal- Not done, not indicated Extrem- cyanosis- none, clubbing, none, atrophy- none, strength- nl Neuro- grossly intact to observation

## 2015-01-11 ENCOUNTER — Encounter: Payer: Self-pay | Admitting: Internal Medicine

## 2015-01-13 NOTE — Assessment & Plan Note (Addendum)
He is satisfied to continue allergy vaccine 1:10 GH Plan-OK to raise vial C (CAT) to1:10 and merged into vial B, then change his injection schedule to every other week

## 2015-01-13 NOTE — Assessment & Plan Note (Signed)
Zyrtec is sufficient now as an antihistamine

## 2015-01-13 NOTE — Assessment & Plan Note (Addendum)
Occasional temazepam is sufficient

## 2015-01-15 ENCOUNTER — Ambulatory Visit (INDEPENDENT_AMBULATORY_CARE_PROVIDER_SITE_OTHER): Payer: 59

## 2015-01-15 DIAGNOSIS — J309 Allergic rhinitis, unspecified: Secondary | ICD-10-CM

## 2015-01-23 ENCOUNTER — Ambulatory Visit (INDEPENDENT_AMBULATORY_CARE_PROVIDER_SITE_OTHER): Payer: 59

## 2015-01-23 DIAGNOSIS — J309 Allergic rhinitis, unspecified: Secondary | ICD-10-CM

## 2015-02-07 ENCOUNTER — Ambulatory Visit (INDEPENDENT_AMBULATORY_CARE_PROVIDER_SITE_OTHER): Payer: 59

## 2015-02-07 DIAGNOSIS — J309 Allergic rhinitis, unspecified: Secondary | ICD-10-CM | POA: Diagnosis not present

## 2015-02-20 ENCOUNTER — Ambulatory Visit (INDEPENDENT_AMBULATORY_CARE_PROVIDER_SITE_OTHER): Payer: 59

## 2015-02-20 DIAGNOSIS — J309 Allergic rhinitis, unspecified: Secondary | ICD-10-CM | POA: Diagnosis not present

## 2015-03-06 ENCOUNTER — Ambulatory Visit (INDEPENDENT_AMBULATORY_CARE_PROVIDER_SITE_OTHER): Payer: 59

## 2015-03-06 DIAGNOSIS — J309 Allergic rhinitis, unspecified: Secondary | ICD-10-CM

## 2015-03-11 ENCOUNTER — Ambulatory Visit: Payer: 59 | Admitting: Internal Medicine

## 2015-03-18 ENCOUNTER — Ambulatory Visit: Payer: 59 | Admitting: Internal Medicine

## 2015-03-20 ENCOUNTER — Ambulatory Visit: Payer: 59

## 2015-03-26 ENCOUNTER — Ambulatory Visit (INDEPENDENT_AMBULATORY_CARE_PROVIDER_SITE_OTHER): Payer: 59

## 2015-03-26 DIAGNOSIS — J309 Allergic rhinitis, unspecified: Secondary | ICD-10-CM | POA: Diagnosis not present

## 2015-04-09 ENCOUNTER — Ambulatory Visit (INDEPENDENT_AMBULATORY_CARE_PROVIDER_SITE_OTHER): Payer: 59

## 2015-04-09 ENCOUNTER — Telehealth: Payer: Self-pay | Admitting: Internal Medicine

## 2015-04-09 DIAGNOSIS — J309 Allergic rhinitis, unspecified: Secondary | ICD-10-CM | POA: Diagnosis not present

## 2015-04-09 NOTE — Telephone Encounter (Signed)
Mr. Zurn has used up his 1:50 vial C. It's time to combine it with A or B (both 1:10). Please advise which one. Which ever you choose I will have to build up very slowly. When you get a chance just let me know.  Thanks, Tammy S.

## 2015-04-09 NOTE — Telephone Encounter (Signed)
Combine with B vial

## 2015-04-09 NOTE — Telephone Encounter (Signed)
Done. Called pt. And told him Dr. Maple Hudson combined Vial C(cat & dog) with Vial B and lmtcb and make an appt. For next wk.. Vaccine will be ready. Nothing further needed.

## 2015-04-19 ENCOUNTER — Encounter: Payer: Self-pay | Admitting: Internal Medicine

## 2015-04-23 ENCOUNTER — Ambulatory Visit (INDEPENDENT_AMBULATORY_CARE_PROVIDER_SITE_OTHER): Payer: 59

## 2015-04-23 ENCOUNTER — Telehealth: Payer: Self-pay | Admitting: Internal Medicine

## 2015-04-23 DIAGNOSIS — J309 Allergic rhinitis, unspecified: Secondary | ICD-10-CM | POA: Diagnosis not present

## 2015-04-23 NOTE — Telephone Encounter (Signed)
I should have asked you this on the last ph. Note,Sorry! You combine cat & dog with vial B; since we're combining these it will be 1:10. He's had problems in the past. Can we try starting at 0.3(vial B) or do I need to start vial B at 0.01. If you remember I've had to do this since we started vial C. I assume you want me to split the vials.(To keep cat & dog separated from vial A.) Please advise.

## 2015-04-23 NOTE — Telephone Encounter (Signed)
Looks good! Done. Nothing further needed.

## 2015-04-23 NOTE — Telephone Encounter (Signed)
i would think to start vial B at 0.1 of 1:10 and build as tolerated. Does that look ok to you?

## 2015-04-24 ENCOUNTER — Telehealth: Payer: Self-pay | Admitting: Internal Medicine

## 2015-04-24 ENCOUNTER — Ambulatory Visit (INDEPENDENT_AMBULATORY_CARE_PROVIDER_SITE_OTHER): Payer: 59

## 2015-04-24 DIAGNOSIS — J309 Allergic rhinitis, unspecified: Secondary | ICD-10-CM

## 2015-04-24 NOTE — Telephone Encounter (Signed)
Date Mixed: 04/24/15 Vial: 2 Strength: 1:10 Here/Mail/Pick Up: here Mixed By: tbs 

## 2015-04-24 NOTE — Telephone Encounter (Signed)
error 

## 2015-05-08 ENCOUNTER — Ambulatory Visit (INDEPENDENT_AMBULATORY_CARE_PROVIDER_SITE_OTHER): Payer: 59

## 2015-05-08 DIAGNOSIS — J309 Allergic rhinitis, unspecified: Secondary | ICD-10-CM

## 2015-05-21 ENCOUNTER — Ambulatory Visit (INDEPENDENT_AMBULATORY_CARE_PROVIDER_SITE_OTHER): Payer: 59

## 2015-05-21 ENCOUNTER — Encounter: Payer: Self-pay | Admitting: Internal Medicine

## 2015-05-21 DIAGNOSIS — J309 Allergic rhinitis, unspecified: Secondary | ICD-10-CM | POA: Diagnosis not present

## 2015-06-04 ENCOUNTER — Ambulatory Visit (INDEPENDENT_AMBULATORY_CARE_PROVIDER_SITE_OTHER): Payer: 59

## 2015-06-04 DIAGNOSIS — J309 Allergic rhinitis, unspecified: Secondary | ICD-10-CM

## 2015-06-18 ENCOUNTER — Ambulatory Visit (INDEPENDENT_AMBULATORY_CARE_PROVIDER_SITE_OTHER): Payer: 59

## 2015-06-18 DIAGNOSIS — J309 Allergic rhinitis, unspecified: Secondary | ICD-10-CM

## 2015-07-02 ENCOUNTER — Ambulatory Visit (INDEPENDENT_AMBULATORY_CARE_PROVIDER_SITE_OTHER): Payer: 59

## 2015-07-02 DIAGNOSIS — J309 Allergic rhinitis, unspecified: Secondary | ICD-10-CM

## 2015-07-16 ENCOUNTER — Ambulatory Visit (INDEPENDENT_AMBULATORY_CARE_PROVIDER_SITE_OTHER): Payer: 59

## 2015-07-16 DIAGNOSIS — J309 Allergic rhinitis, unspecified: Secondary | ICD-10-CM | POA: Diagnosis not present

## 2015-07-30 ENCOUNTER — Ambulatory Visit (INDEPENDENT_AMBULATORY_CARE_PROVIDER_SITE_OTHER): Payer: 59

## 2015-07-30 DIAGNOSIS — J309 Allergic rhinitis, unspecified: Secondary | ICD-10-CM | POA: Diagnosis not present

## 2015-08-13 ENCOUNTER — Ambulatory Visit (INDEPENDENT_AMBULATORY_CARE_PROVIDER_SITE_OTHER): Payer: 59

## 2015-08-13 DIAGNOSIS — J309 Allergic rhinitis, unspecified: Secondary | ICD-10-CM | POA: Diagnosis not present

## 2015-08-28 ENCOUNTER — Ambulatory Visit (INDEPENDENT_AMBULATORY_CARE_PROVIDER_SITE_OTHER): Payer: 59

## 2015-08-28 DIAGNOSIS — J309 Allergic rhinitis, unspecified: Secondary | ICD-10-CM

## 2015-09-11 ENCOUNTER — Ambulatory Visit (INDEPENDENT_AMBULATORY_CARE_PROVIDER_SITE_OTHER): Payer: BLUE CROSS/BLUE SHIELD

## 2015-09-11 DIAGNOSIS — J309 Allergic rhinitis, unspecified: Secondary | ICD-10-CM

## 2015-09-25 ENCOUNTER — Ambulatory Visit (INDEPENDENT_AMBULATORY_CARE_PROVIDER_SITE_OTHER): Payer: BLUE CROSS/BLUE SHIELD

## 2015-09-25 DIAGNOSIS — J309 Allergic rhinitis, unspecified: Secondary | ICD-10-CM | POA: Diagnosis not present

## 2015-10-01 ENCOUNTER — Ambulatory Visit (INDEPENDENT_AMBULATORY_CARE_PROVIDER_SITE_OTHER): Payer: BLUE CROSS/BLUE SHIELD

## 2015-10-01 DIAGNOSIS — J309 Allergic rhinitis, unspecified: Secondary | ICD-10-CM

## 2015-10-09 ENCOUNTER — Ambulatory Visit: Payer: BLUE CROSS/BLUE SHIELD

## 2015-10-15 ENCOUNTER — Ambulatory Visit (INDEPENDENT_AMBULATORY_CARE_PROVIDER_SITE_OTHER): Payer: BLUE CROSS/BLUE SHIELD

## 2015-10-15 DIAGNOSIS — J309 Allergic rhinitis, unspecified: Secondary | ICD-10-CM

## 2015-10-16 ENCOUNTER — Ambulatory Visit: Payer: BLUE CROSS/BLUE SHIELD

## 2015-10-30 ENCOUNTER — Ambulatory Visit (INDEPENDENT_AMBULATORY_CARE_PROVIDER_SITE_OTHER): Payer: BLUE CROSS/BLUE SHIELD | Admitting: *Deleted

## 2015-10-30 DIAGNOSIS — J309 Allergic rhinitis, unspecified: Secondary | ICD-10-CM

## 2015-11-06 ENCOUNTER — Ambulatory Visit: Payer: BLUE CROSS/BLUE SHIELD

## 2015-11-13 ENCOUNTER — Ambulatory Visit (INDEPENDENT_AMBULATORY_CARE_PROVIDER_SITE_OTHER): Payer: BLUE CROSS/BLUE SHIELD | Admitting: *Deleted

## 2015-11-13 DIAGNOSIS — J309 Allergic rhinitis, unspecified: Secondary | ICD-10-CM | POA: Diagnosis not present

## 2015-11-19 ENCOUNTER — Telehealth: Payer: Self-pay | Admitting: Internal Medicine

## 2015-11-19 DIAGNOSIS — J309 Allergic rhinitis, unspecified: Secondary | ICD-10-CM | POA: Diagnosis not present

## 2015-11-19 NOTE — Telephone Encounter (Signed)
Allergy Serum Extract Date Mixed: 11/19/15 Vial: 2 Strength: 1:10 Here/Mail/Pick Up: here Mixed By: tbs Last OV: 01/10/15 Pending OV: 01/23/16

## 2015-11-20 ENCOUNTER — Ambulatory Visit (INDEPENDENT_AMBULATORY_CARE_PROVIDER_SITE_OTHER): Payer: BLUE CROSS/BLUE SHIELD | Admitting: *Deleted

## 2015-11-20 DIAGNOSIS — J309 Allergic rhinitis, unspecified: Secondary | ICD-10-CM

## 2015-11-27 ENCOUNTER — Ambulatory Visit (INDEPENDENT_AMBULATORY_CARE_PROVIDER_SITE_OTHER): Payer: BLUE CROSS/BLUE SHIELD

## 2015-11-27 DIAGNOSIS — J309 Allergic rhinitis, unspecified: Secondary | ICD-10-CM | POA: Diagnosis not present

## 2015-12-04 ENCOUNTER — Ambulatory Visit: Payer: BLUE CROSS/BLUE SHIELD

## 2015-12-05 ENCOUNTER — Ambulatory Visit (INDEPENDENT_AMBULATORY_CARE_PROVIDER_SITE_OTHER): Payer: BLUE CROSS/BLUE SHIELD | Admitting: *Deleted

## 2015-12-05 DIAGNOSIS — J309 Allergic rhinitis, unspecified: Secondary | ICD-10-CM

## 2015-12-18 ENCOUNTER — Ambulatory Visit (INDEPENDENT_AMBULATORY_CARE_PROVIDER_SITE_OTHER): Payer: BLUE CROSS/BLUE SHIELD | Admitting: *Deleted

## 2015-12-18 DIAGNOSIS — J309 Allergic rhinitis, unspecified: Secondary | ICD-10-CM

## 2015-12-18 NOTE — Progress Notes (Signed)
I started giving pt. One shot at his request, 12/06/15, 0948; & today's shot 12/18/15,1704. Please charge pt. One shot ONLY from now on. Thanks, [employer] Scott

## 2015-12-24 ENCOUNTER — Ambulatory Visit (INDEPENDENT_AMBULATORY_CARE_PROVIDER_SITE_OTHER): Payer: BLUE CROSS/BLUE SHIELD | Admitting: Internal Medicine

## 2015-12-24 ENCOUNTER — Encounter: Payer: Self-pay | Admitting: Internal Medicine

## 2015-12-24 VITALS — BP 114/68 | HR 102 | Temp 99.5°F | Ht 69.0 in | Wt 201.8 lb

## 2015-12-24 DIAGNOSIS — J209 Acute bronchitis, unspecified: Secondary | ICD-10-CM | POA: Insufficient documentation

## 2015-12-24 DIAGNOSIS — J309 Allergic rhinitis, unspecified: Secondary | ICD-10-CM

## 2015-12-24 DIAGNOSIS — G4700 Insomnia, unspecified: Secondary | ICD-10-CM | POA: Diagnosis not present

## 2015-12-24 DIAGNOSIS — L500 Allergic urticaria: Secondary | ICD-10-CM | POA: Diagnosis not present

## 2015-12-24 DIAGNOSIS — J302 Other seasonal allergic rhinitis: Secondary | ICD-10-CM

## 2015-12-24 DIAGNOSIS — J3089 Other allergic rhinitis: Secondary | ICD-10-CM

## 2015-12-24 MED ORDER — AZITHROMYCIN 250 MG PO TABS: ORAL_TABLET | ORAL | Status: DC

## 2015-12-24 MED ORDER — METHYLPREDNISOLONE ACETATE 80 MG/ML IJ SUSP
80.0000 mg | Freq: Once | INTRAMUSCULAR | Status: AC
  Administered 2015-12-24: 80 mg via INTRAMUSCULAR

## 2015-12-24 MED ORDER — TEMAZEPAM 15 MG PO CAPS: 15.0000 mg | ORAL_CAPSULE | Freq: Every evening | ORAL | Status: DC | PRN

## 2015-12-24 MED ORDER — AZELASTINE-FLUTICASONE 137-50 MCG/ACT NA SUSP: 2.0000 | Freq: Every day | NASAL | Status: DC

## 2015-12-24 MED ORDER — HYDROCODONE-HOMATROPINE 5-1.5 MG/5ML PO SYRP: 5.0000 mL | ORAL_SOLUTION | Freq: Four times a day (QID) | ORAL | Status: DC | PRN

## 2015-12-24 NOTE — Assessment & Plan Note (Signed)
Feels he is getting worse and that cough is beyond control by OTC meds Plan-Depo-Medrol, Z-Pak to hold, hydration, Mucinex

## 2015-12-24 NOTE — Patient Instructions (Addendum)
Print script for hycodan cough syrup to use if needed  Print script for Z pak antibiotic to start if this begins to seem more bacterial- fever and green, etc  Proint script refilling  Temazepam  Stay well hydrated  Depo 80-   Acute bronchitis  Sample dymista nasal spray     1-2 puffs each nostril once daily while needed

## 2015-12-24 NOTE — Assessment & Plan Note (Signed)
Well-controlled with no recent outbreaks

## 2015-12-24 NOTE — Progress Notes (Signed)
Patient ID: Jose Mahoney, male    DOB: May 14, 1966, 50 y.o.   MRN: [id number]  HPI [name] former smoker,  followed for chronic insomnia, allergic rhinitis. Last here in October 01, 2010 for allergy testing and started on allergy vaccine. Now building here- at 1:500. We discussed the buildup process, concomittant antihistamines, and anaphyllaxis.  He tried singulair and found in retrospect he was breathing easier in his chest, especially with yard work or Educational psychologistanimal exposure.  Temazepam 15 mg does help sleep, used 1-2x/ week. He feels it helps and is sufficient for his needs for now.  04/02/11- [name] former smoker,  followed for chronic insomnia, allergic rhinitis. Slowly building allergy vaccine now at 1:50, has had some local reactions. He reports fairly smooth Spring season this year- no significant problems. Holding at 0.3 of 1:50. Had continued twice weekly. Still taking Zyrtec.  Temazepam works well for intermittent chronic insomnia, used 2-3 x/ week.   10/07/11- [name] former smoker,  followed for chronic insomnia, allergic rhinitis, here in October 01, 2010 for allergy testing and started on allergy vaccine. Vaccine injection record reviewed. Acute visit complaining of recent onset sore throat with postnasal drainage some cough with light yellow sputum, no fever. Did get flu vaccine. Has been getting allergy vaccine without problems. We discussed pending spring pollen season but agreed the current illness has been viral.  04/05/12- [name] former smoker,  followed for chronic insomnia, allergic rhinitis, here in October 01, 2010 for allergy testing and started on allergy vaccine. Pt states nasal drainage still the same. pt denies sore throat. pt still getting allergy injections once weekly-with mild reactions Vaccine is at 1:10 GH. We discussed recent mild local reactions on 0.2604ml. Usually feels congested nose with breakthrough drip. Using Nasalcrom occasionally. Uses Zyrtec or  Benadryl. Insomnia adequately managed with temazepam used a couple of nights per week/well tolerated.  08/22/12- [name] former smoker,  followed for chronic insomnia, allergic rhinitis, here in October 01, 2010 for allergy testing and started on allergy vaccine. ACUTE VISIT: new dog for about a month now-noticed rash on hands-unknown cause Pruritus comes and goes. He shows faint rash fading on the back of his left hand. He has an arc-shaped linear red rash on his right thumb. He has been treating with topical Benadryl cream and taking Zyrtec daily. He has cats and has had a new dog for 5 weeks.  10/11/12- [name] former smoker,  followed for chronic insomnia, allergic rhinitis,urticaria- here in October 01, 2010 for allergy testing and started on allergy vaccine. follows for: pt states stil breaking out in hives,not sure if this is caused by flea an tick oil for dog. has .some sob,wheezing,.states that allergy injections 1:10 GH seems to be helping rinitis.  Got a new dog. 2 months later onset of isolated urticaria on hands. Started new flea and tick medication 3 weeks before onset of hives.  01/10/13- [name] former smoker,  followed for chronic insomnia, allergic rhinitis,urticaria FOLLOWS FOR: still on vaccine; review allergy lab work with patient. Still having break outs of hives. Lives w/ girlfriend and together they have a lot of animals. Has had urticaria 3 out of 4 weeks, including onset while out of town, while mowing yard.  Daily Zyrtec 2 in AM, 2in PM suggested by dermatologist. Also occ benadryl. Notes wheeze if skips singulair. Allergy Profile 10/11/12 Total IgE 1044, High for cat and for dust/ mite, grasses, trees, weeds.  Continues allergy vaccine 1:10 here.  Mix w/o cat/dog.   03/06/13- [name] former smoker,  followed for chronic insomnia, allergic rhinitis,urticaria FOLLOWS FOR: pt denies any concerns at this time-- has not had any hives since last visit-- cont to hold on xolair We  started parallel vaccine build with cat/ dog- progressing w/o problem ans he continues 1:10 regular vaccine.  Only a single hive since last here, taking Zyrtec and Zantac daily. R shoulder pain incidental arthralgia, not related to allergy injection.   09/05/13- [name] former smoker,  followed for chronic insomnia, allergic rhinitis,urticaria FOLLOWS FOR: still on vaccine 1:10 GH; has not had any flare ups of hives in past 6 months-got back on Zantac Still building cat allergen as a separate vial now at 1:500. Urticaria on hand his prevented by both Zyrtec plus Zantac. No longer having significant attacks. Asks about potential benefit from a restrictive diet, excluding gluten, etc- discussed. Sleeping better, off temazepam but using an herbal product instead.  03/08/14- 47 yoM former smoker,  followed for chronic insomnia, allergic rhinitis,urticaria FOLLOWS FOR: still on Allergy vaccine 1:10 GH and doing well. No hives in a long time. Daily Zantac seems to help [facility] with allergy vaccine- had merged in cat antigen.  Insomnia- worried about job- company is moving.   01/10/15- 33 yoM former smoker,  followed for chronic insomnia, allergic rhinitis,urticaria FOLLOWS FOR: still on Allergy vaccine 1:10 GH and doing well overall. Dieted off 40 lbs, avoiding dairy, gluten.  Fewer hives Less need for allergy meds, continues  Zyrtec Insomnia better with occasional temazepam still  12/24/2015-50 year old male former smoker followed for chronic insomnia, allergic rhinitis, urticaria Allergy Vaccine 1:10 GH FOLLOWS FOR: c/o cough dry x 3days worse at hs,PND, sorethroat,lightheaded,temp. this am 99.9,denies sob and wheezing,sinus pr. and headaches,runny nose-clear.Still getting allergy shots wkly.doing well with. Acute visit. Initially had/sinus infection progressing into his chest. Insomnia adequately controlled. Needing temazepam only about 12 times a year. Continues allergy vaccine. Discussed viral versus  allergic symptoms with acute illness  ROS-see HPI Constitutional:     weight loss, no- night sweats, fevers, chills, fatigue, lassitude. HEENT:   No-  headaches, difficulty swallowing, tooth/dental problems,  sore throat,       No-  sneezing, itching, ear ache, + congestion, post nasal drip,  CV:  No-   chest pain, orthopnea, PND, swelling in lower extremities, anasarca, dizziness, palpitations Resp: No-   shortness of breath with exertion or at rest.              No-   productive cough,  + non-productive cough,  No- coughing up of blood.      No-   change in color of mucus.  No-occasional wheezing.   Skin: HPI GI:  No-   heartburn, indigestion, abdominal pain, nausea, vomiting,  GU:  MS:  No-   joint pain or swelling.   Neuro-     nothing unusual Psych:  No- change in mood or affect. No depression or anxiety.  No memory loss.  Objective:   Physical Exam General- Alert, Oriented, Affect-appropriate, Distress- none acute, fit-appearing/ stocky Skin- ,excoriation- none, No rash demonstrated at this visit.  Lymphadenopathy- none Head- atraumatic            Eyes- Gross vision intact, PERRLA, conjunctivae clear secretions            Ears- Hearing, canals            Nose-  No-Septal dev, mucus, polyps, erosion, perforation. nasal mucosa + edema Throat- Mallampati  III , mucosa + Red from lozenges , drainage + mucoid, tonsils- atrophic Neck- flexible , trachea midline, no stridor , thyroid nl, carotid no bruit Chest - symmetrical excursion , unlabored           Heart/CV- RRR , no murmur , no gallop  , no rub, nl s1 s2                           - JVD- none , edema- none, stasis changes- none, varices- none           Lung- clear to P&A, wheeze- none, cough + , dullness-none, rub- none           Chest wall-  Abd-  Br/ Gen/ Rectal- Not done, not indicated Extrem- cyanosis- none, clubbing, none, atrophy- none, strength- nl Neuro- grossly intact to observation

## 2015-12-24 NOTE — Assessment & Plan Note (Signed)
He continues allergy vaccine. Discussion of allergic versus viral symptoms. I favor the current illness being infection. Plan-Depo-Medrol, sample Dymista nasal spray, Z-Pak to hold with discussion

## 2015-12-24 NOTE — Assessment & Plan Note (Signed)
Doing okay with occasional use of temazepam as discussed

## 2015-12-25 ENCOUNTER — Ambulatory Visit: Payer: BLUE CROSS/BLUE SHIELD

## 2016-01-01 ENCOUNTER — Ambulatory Visit (INDEPENDENT_AMBULATORY_CARE_PROVIDER_SITE_OTHER): Payer: BLUE CROSS/BLUE SHIELD

## 2016-01-01 DIAGNOSIS — J309 Allergic rhinitis, unspecified: Secondary | ICD-10-CM

## 2016-01-08 ENCOUNTER — Ambulatory Visit (INDEPENDENT_AMBULATORY_CARE_PROVIDER_SITE_OTHER): Payer: BLUE CROSS/BLUE SHIELD

## 2016-01-08 DIAGNOSIS — J309 Allergic rhinitis, unspecified: Secondary | ICD-10-CM | POA: Diagnosis not present

## 2016-01-15 ENCOUNTER — Ambulatory Visit (INDEPENDENT_AMBULATORY_CARE_PROVIDER_SITE_OTHER): Payer: BLUE CROSS/BLUE SHIELD | Admitting: *Deleted

## 2016-01-15 DIAGNOSIS — J309 Allergic rhinitis, unspecified: Secondary | ICD-10-CM

## 2016-01-15 NOTE — Progress Notes (Signed)
One shot since 12/06/15.

## 2016-01-16 ENCOUNTER — Encounter: Payer: Self-pay | Admitting: Internal Medicine

## 2016-01-23 ENCOUNTER — Ambulatory Visit (INDEPENDENT_AMBULATORY_CARE_PROVIDER_SITE_OTHER): Payer: BLUE CROSS/BLUE SHIELD | Admitting: Internal Medicine

## 2016-01-23 ENCOUNTER — Encounter: Payer: Self-pay | Admitting: Internal Medicine

## 2016-01-23 ENCOUNTER — Ambulatory Visit (INDEPENDENT_AMBULATORY_CARE_PROVIDER_SITE_OTHER): Payer: BLUE CROSS/BLUE SHIELD | Admitting: *Deleted

## 2016-01-23 VITALS — BP 118/74 | HR 60 | Ht 69.0 in | Wt 202.0 lb

## 2016-01-23 DIAGNOSIS — J309 Allergic rhinitis, unspecified: Secondary | ICD-10-CM

## 2016-01-23 DIAGNOSIS — G4700 Insomnia, unspecified: Secondary | ICD-10-CM

## 2016-01-23 DIAGNOSIS — J45998 Other asthma: Secondary | ICD-10-CM

## 2016-01-23 MED ORDER — EPINEPHRINE 0.3 MG/0.3ML IJ SOAJ
INTRAMUSCULAR | Status: DC
Start: 2016-01-23 — End: 2019-11-28

## 2016-01-23 NOTE — Progress Notes (Signed)
Patient ID: Jose Mahoney, male    DOB: Jan 07, 1966, 50 y.o.   MRN: [id number]  HPI 86 yoM former smoker,  followed for chronic insomnia, allergic rhinitis. Last here in October 01, 2010 for allergy testing and started on allergy vaccine. Now building here- at 1:500. We discussed the buildup process, concomittant antihistamines, and anaphyllaxis.  He tried singulair and found in retrospect he was breathing easier in his chest, especially with yard work or Educational psychologist exposure.  Temazepam 15 mg does help sleep, used 1-2x/ week. He feels it helps and is sufficient for his needs for now.  04/02/11- [name] former smoker,  followed for chronic insomnia, allergic rhinitis. Slowly building allergy vaccine now at 1:50, has had some local reactions. He reports fairly smooth Spring season this year- no significant problems. Holding at 0.3 of 1:50. Had continued twice weekly. Still taking Zyrtec.  Temazepam works well for intermittent chronic insomnia, used 2-3 x/ week.   10/07/11- [name] former smoker,  followed for chronic insomnia, allergic rhinitis, here in October 01, 2010 for allergy testing and started on allergy vaccine. Vaccine injection record reviewed. Acute visit complaining of recent onset sore throat with postnasal drainage some cough with light yellow sputum, no fever. Did get flu vaccine. Has been getting allergy vaccine without problems. We discussed pending spring pollen season but agreed the current illness has been viral.  04/05/12- [name] former smoker,  followed for chronic insomnia, allergic rhinitis, here in October 01, 2010 for allergy testing and started on allergy vaccine. Pt states nasal drainage still the same. pt denies sore throat. pt still getting allergy injections once weekly-with mild reactions Vaccine is at 1:10 GH. We discussed recent mild local reactions on 0.72ml. Usually feels congested nose with breakthrough drip. Using Nasalcrom occasionally. Uses Zyrtec or  Benadryl. Insomnia adequately managed with temazepam used a couple of nights per week/well tolerated.  08/22/12- [name] former smoker,  followed for chronic insomnia, allergic rhinitis, here in October 01, 2010 for allergy testing and started on allergy vaccine. ACUTE VISIT: new dog for about a month now-noticed rash on hands-unknown cause Pruritus comes and goes. He shows faint rash fading on the back of his left hand. He has an arc-shaped linear red rash on his right thumb. He has been treating with topical Benadryl cream and taking Zyrtec daily. He has cats and has had a new dog for 5 weeks.  10/11/12- [name] former smoker,  followed for chronic insomnia, allergic rhinitis,urticaria- here in October 01, 2010 for allergy testing and started on allergy vaccine. follows for: pt states stil breaking out in hives,not sure if this is caused by flea an tick oil for dog. has .some sob,wheezing,.states that allergy injections 1:10 GH seems to be helping rinitis.  Got a new dog. 2 months later onset of isolated urticaria on hands. Started new flea and tick medication 3 weeks before onset of hives.  01/10/13- [name] former smoker,  followed for chronic insomnia, allergic rhinitis,urticaria FOLLOWS FOR: still on vaccine; review allergy lab work with patient. Still having break outs of hives. Lives w/ girlfriend and together they have a lot of animals. Has had urticaria 3 out of 4 weeks, including onset while out of town, while mowing yard.  Daily Zyrtec 2 in AM, 2in PM suggested by dermatologist. Also occ benadryl. Notes wheeze if skips singulair. Allergy Profile 10/11/12 Total IgE 1044, High for cat and for dust/ mite, grasses, trees, weeds.  Continues allergy vaccine 1:10 here.  Mix w/o cat/dog.   03/06/13- [name] former smoker,  followed for chronic insomnia, allergic rhinitis,urticaria FOLLOWS FOR: pt denies any concerns at this time-- has not had any hives since last visit-- cont to hold on xolair We  started parallel vaccine build with cat/ dog- progressing w/o problem ans he continues 1:10 regular vaccine.  Only a single hive since last here, taking Zyrtec and Zantac daily. R shoulder pain incidental arthralgia, not related to allergy injection.   09/05/13- [name] former smoker,  followed for chronic insomnia, allergic rhinitis,urticaria FOLLOWS FOR: still on vaccine 1:10 GH; has not had any flare ups of hives in past 6 months-got back on Zantac Still building cat allergen as a separate vial now at 1:500. Urticaria on hand his prevented by both Zyrtec plus Zantac. No longer having significant attacks. Asks about potential benefit from a restrictive diet, excluding gluten, etc- discussed. Sleeping better, off temazepam but using an herbal product instead.  03/08/14- 47 yoM former smoker,  followed for chronic insomnia, allergic rhinitis,urticaria FOLLOWS FOR: still on Allergy vaccine 1:10 GH and doing well. No hives in a long time. Daily Zantac seems to help [facility] with allergy vaccine- had merged in cat antigen.  Insomnia- worried about job- company is moving.   01/10/15- 63 yoM former smoker,  followed for chronic insomnia, allergic rhinitis,urticaria FOLLOWS FOR: still on Allergy vaccine 1:10 GH and doing well overall. Dieted off 40 lbs, avoiding dairy, gluten.  Fewer hives Less need for allergy meds, continues  Zyrtec Insomnia better with occasional temazepam still  12/24/2015-50 year old male former smoker followed for chronic insomnia, allergic rhinitis, urticaria Allergy Vaccine 1:10 GH FOLLOWS FOR: c/o cough dry x 3days worse at hs,PND, sorethroat,lightheaded,temp. this am 99.9,denies sob and wheezing,sinus pr. and headaches,runny nose-clear.Still getting allergy shots wkly.doing well with. Acute visit. Initially had/sinus infection progressing into his chest. Insomnia adequately controlled. Needing temazepam only about 12 times a year. Continues allergy vaccine. Discussed viral versus  allergic symptoms with acute illness  01/23/2016-50 year old male former smoker followed for chronic insomnia, allergic rhinitis, urticaria Allergy Vaccine 1:10 GH FOLLOWS FOR: Still on vaccine and no reactions. Pt will need new Rx for Epipen sent to [facility].  Still uses occasional temazepam, not often, for insomnia  ROS-see HPI Constitutional:     weight loss, no- night sweats, fevers, chills, fatigue, lassitude. HEENT:   No-  headaches, difficulty swallowing, tooth/dental problems,  sore throat,       No-  sneezing, itching, ear ache, + congestion, post nasal drip,  CV:  No-   chest pain, orthopnea, PND, swelling in lower extremities, anasarca, dizziness, palpitations Resp: No-   shortness of breath with exertion or at rest.              No-   productive cough,  + non-productive cough,  No- coughing up of blood.      No-   change in color of mucus.  No-occasional wheezing.   Skin: HPI GI:  No-   heartburn, indigestion, abdominal pain, nausea, vomiting,  GU:  MS:  No-   joint pain or swelling.   Neuro-     nothing unusual Psych:  No- change in mood or affect. No depression or anxiety.  No memory loss.  Objective:   Physical Exam General- Alert, Oriented, Affect-appropriate, Distress- none acute, fit-appearing/ stocky Skin- ,excoriation- none, No rash demonstrated at this visit.  Lymphadenopathy- none Head- atraumatic            Eyes- Gross vision  intact, PERRLA, conjunctivae clear secretions            Ears- Hearing, canals            Nose-  No-Septal dev, mucus, polyps, erosion, perforation. nasal mucosa + edema Throat- Mallampati III , mucosa-Clear, drainage -none, tonsils- atrophic Neck- flexible , trachea midline, no stridor , thyroid nl, carotid no bruit Chest - symmetrical excursion , unlabored           Heart/CV- RRR , no murmur , no gallop  , no rub, nl s1 s2                           - JVD- none , edema- none, stasis changes- none, varices- none            Lung- clear to P&A, wheeze- none, cough-None , dullness-none, rub- none           Chest wall-  Abd-  Br/ Gen/ Rectal- Not done, not indicated Extrem- cyanosis- none, clubbing, none, atrophy- none, strength- nl Neuro- grossly intact to observation

## 2016-01-23 NOTE — Patient Instructions (Signed)
Epipen refill script with coupon  We can continue allergy vaccine as discussed  Please call as needed

## 2016-01-27 NOTE — Assessment & Plan Note (Signed)
Mild intermittent well-controlled. We discussed plans to discontinue the allergy clinic at this practice in the next year. He will be able to transfer to another practice.

## 2016-01-27 NOTE — Assessment & Plan Note (Signed)
We discussed good sleep habits and appropriate use of occasional sleep medicine

## 2016-01-29 ENCOUNTER — Ambulatory Visit (INDEPENDENT_AMBULATORY_CARE_PROVIDER_SITE_OTHER): Payer: BLUE CROSS/BLUE SHIELD | Admitting: *Deleted

## 2016-01-29 DIAGNOSIS — J309 Allergic rhinitis, unspecified: Secondary | ICD-10-CM | POA: Diagnosis not present

## 2016-02-05 ENCOUNTER — Ambulatory Visit (INDEPENDENT_AMBULATORY_CARE_PROVIDER_SITE_OTHER): Payer: BLUE CROSS/BLUE SHIELD | Admitting: *Deleted

## 2016-02-05 DIAGNOSIS — J309 Allergic rhinitis, unspecified: Secondary | ICD-10-CM

## 2016-02-12 ENCOUNTER — Ambulatory Visit: Payer: BLUE CROSS/BLUE SHIELD

## 2016-02-19 ENCOUNTER — Ambulatory Visit (INDEPENDENT_AMBULATORY_CARE_PROVIDER_SITE_OTHER): Payer: BLUE CROSS/BLUE SHIELD | Admitting: *Deleted

## 2016-02-19 DIAGNOSIS — J309 Allergic rhinitis, unspecified: Secondary | ICD-10-CM | POA: Diagnosis not present

## 2016-02-26 ENCOUNTER — Ambulatory Visit (INDEPENDENT_AMBULATORY_CARE_PROVIDER_SITE_OTHER): Payer: BLUE CROSS/BLUE SHIELD | Admitting: *Deleted

## 2016-02-26 DIAGNOSIS — J309 Allergic rhinitis, unspecified: Secondary | ICD-10-CM | POA: Diagnosis not present

## 2016-03-04 ENCOUNTER — Ambulatory Visit (INDEPENDENT_AMBULATORY_CARE_PROVIDER_SITE_OTHER): Payer: BLUE CROSS/BLUE SHIELD | Admitting: *Deleted

## 2016-03-04 DIAGNOSIS — J309 Allergic rhinitis, unspecified: Secondary | ICD-10-CM | POA: Diagnosis not present

## 2016-03-11 ENCOUNTER — Ambulatory Visit (INDEPENDENT_AMBULATORY_CARE_PROVIDER_SITE_OTHER): Payer: BLUE CROSS/BLUE SHIELD | Admitting: *Deleted

## 2016-03-11 DIAGNOSIS — J309 Allergic rhinitis, unspecified: Secondary | ICD-10-CM

## 2016-03-18 ENCOUNTER — Ambulatory Visit (INDEPENDENT_AMBULATORY_CARE_PROVIDER_SITE_OTHER): Payer: BLUE CROSS/BLUE SHIELD | Admitting: *Deleted

## 2016-03-18 DIAGNOSIS — J309 Allergic rhinitis, unspecified: Secondary | ICD-10-CM

## 2016-04-01 ENCOUNTER — Ambulatory Visit (INDEPENDENT_AMBULATORY_CARE_PROVIDER_SITE_OTHER): Payer: BLUE CROSS/BLUE SHIELD | Admitting: *Deleted

## 2016-04-01 DIAGNOSIS — J309 Allergic rhinitis, unspecified: Secondary | ICD-10-CM

## 2016-04-08 ENCOUNTER — Ambulatory Visit (INDEPENDENT_AMBULATORY_CARE_PROVIDER_SITE_OTHER): Payer: BLUE CROSS/BLUE SHIELD

## 2016-04-08 DIAGNOSIS — J309 Allergic rhinitis, unspecified: Secondary | ICD-10-CM | POA: Diagnosis not present

## 2016-04-13 ENCOUNTER — Telehealth: Payer: Self-pay | Admitting: Internal Medicine

## 2016-04-13 NOTE — Telephone Encounter (Signed)
Allergy Serum Extract Date Mixed: 04/13/16 Vial: 2 Strength: 1:10 Here/Mail/Pick Up: here Mixed By: tbs Last OV: 01/23/16 Pending OV: N/A

## 2016-04-15 ENCOUNTER — Ambulatory Visit (INDEPENDENT_AMBULATORY_CARE_PROVIDER_SITE_OTHER): Payer: BLUE CROSS/BLUE SHIELD | Admitting: *Deleted

## 2016-04-15 DIAGNOSIS — J309 Allergic rhinitis, unspecified: Secondary | ICD-10-CM | POA: Diagnosis not present

## 2016-04-21 ENCOUNTER — Telehealth: Payer: Self-pay | Admitting: Internal Medicine

## 2016-04-21 DIAGNOSIS — J309 Allergic rhinitis, unspecified: Secondary | ICD-10-CM | POA: Diagnosis not present

## 2016-04-21 NOTE — Telephone Encounter (Signed)
Allergy Serum Extract Date Mixed: 04/21/16 Vial: 2 Strength: 1:10 Here/Mail/Pick Up: here Mixed By: tbs Last OV: 01/23/16 Pending OV: N/A

## 2016-04-22 ENCOUNTER — Ambulatory Visit: Payer: BLUE CROSS/BLUE SHIELD

## 2016-04-29 ENCOUNTER — Ambulatory Visit (INDEPENDENT_AMBULATORY_CARE_PROVIDER_SITE_OTHER): Payer: BLUE CROSS/BLUE SHIELD | Admitting: *Deleted

## 2016-04-29 DIAGNOSIS — J309 Allergic rhinitis, unspecified: Secondary | ICD-10-CM | POA: Diagnosis not present

## 2016-05-06 ENCOUNTER — Ambulatory Visit (INDEPENDENT_AMBULATORY_CARE_PROVIDER_SITE_OTHER): Payer: BLUE CROSS/BLUE SHIELD | Admitting: *Deleted

## 2016-05-06 DIAGNOSIS — J309 Allergic rhinitis, unspecified: Secondary | ICD-10-CM | POA: Diagnosis not present

## 2016-05-13 ENCOUNTER — Ambulatory Visit (INDEPENDENT_AMBULATORY_CARE_PROVIDER_SITE_OTHER): Payer: BLUE CROSS/BLUE SHIELD | Admitting: *Deleted

## 2016-05-13 DIAGNOSIS — J309 Allergic rhinitis, unspecified: Secondary | ICD-10-CM | POA: Diagnosis not present

## 2016-05-21 ENCOUNTER — Ambulatory Visit (INDEPENDENT_AMBULATORY_CARE_PROVIDER_SITE_OTHER): Payer: BLUE CROSS/BLUE SHIELD | Admitting: *Deleted

## 2016-05-21 DIAGNOSIS — J309 Allergic rhinitis, unspecified: Secondary | ICD-10-CM

## 2016-05-27 ENCOUNTER — Ambulatory Visit (INDEPENDENT_AMBULATORY_CARE_PROVIDER_SITE_OTHER): Payer: BLUE CROSS/BLUE SHIELD | Admitting: *Deleted

## 2016-05-27 DIAGNOSIS — J309 Allergic rhinitis, unspecified: Secondary | ICD-10-CM

## 2016-06-03 ENCOUNTER — Ambulatory Visit (INDEPENDENT_AMBULATORY_CARE_PROVIDER_SITE_OTHER): Payer: BLUE CROSS/BLUE SHIELD | Admitting: *Deleted

## 2016-06-03 DIAGNOSIS — J309 Allergic rhinitis, unspecified: Secondary | ICD-10-CM | POA: Diagnosis not present

## 2016-06-10 ENCOUNTER — Ambulatory Visit (INDEPENDENT_AMBULATORY_CARE_PROVIDER_SITE_OTHER): Payer: BLUE CROSS/BLUE SHIELD | Admitting: *Deleted

## 2016-06-10 DIAGNOSIS — J309 Allergic rhinitis, unspecified: Secondary | ICD-10-CM

## 2016-06-18 ENCOUNTER — Ambulatory Visit (INDEPENDENT_AMBULATORY_CARE_PROVIDER_SITE_OTHER): Payer: BLUE CROSS/BLUE SHIELD | Admitting: *Deleted

## 2016-06-18 DIAGNOSIS — J309 Allergic rhinitis, unspecified: Secondary | ICD-10-CM | POA: Diagnosis not present

## 2016-06-25 ENCOUNTER — Encounter: Payer: Self-pay | Admitting: Allergy & Immunology

## 2016-06-25 ENCOUNTER — Ambulatory Visit (INDEPENDENT_AMBULATORY_CARE_PROVIDER_SITE_OTHER): Payer: BLUE CROSS/BLUE SHIELD | Admitting: Allergy & Immunology

## 2016-06-25 VITALS — BP 120/80 | HR 80 | Temp 98.2°F | Ht 67.5 in | Wt 221.6 lb

## 2016-06-25 DIAGNOSIS — J3081 Allergic rhinitis due to animal (cat) (dog) hair and dander: Secondary | ICD-10-CM | POA: Diagnosis not present

## 2016-06-25 DIAGNOSIS — L508 Other urticaria: Secondary | ICD-10-CM

## 2016-06-25 DIAGNOSIS — J45990 Exercise induced bronchospasm: Secondary | ICD-10-CM

## 2016-06-25 MED ORDER — ALBUTEROL SULFATE 108 (90 BASE) MCG/ACT IN AEPB: 1.0000 | INHALATION_SPRAY | Freq: Every day | RESPIRATORY_TRACT | 3 refills | Status: DC

## 2016-06-25 NOTE — Patient Instructions (Addendum)
1. Chronic allergic rhinitis - We will get your immunotherapy prescription from Dr. Maple HudsonYoung and mix vials here. - Immunotherapy Consent signed today.  - We do not need to do repeat skin testing at this time. - Make an appointment in one month for your first allergy injection.  - This will give you time to use up your vials at Dr. Roxy CedarYoung's office.  2. Exercise-induced bronchospasm - You did have some obstruction on your lung testing today. - Albuterol did not seem to make a big difference. - Continue with Singulair 10mg  daily. - Start ProAir RespiClick two puffs 10 minutes prior to physical activity. - There is no indication for a controller medication at this time.   3. Chronic urticaria - Controlled with the antihistamines.  - Try decreasing your cetirizine as tolerated.   4. Return in about 4 weeks (around 07/23/2016) for the first allergy injection and an office visit.  Please inform us of any Emergency Department visits, hospitalizations, or changes in symptoms. Call us before going to the ED for breathing or allergy symptoms since we might be able to fit you in for a sick visit. Feel free to contact us anytime with any questions, problems, or concerns.  It was a pleasure to meet you today!   Websites that have reliable patient information: 1. American Academy of Asthma, Allergy, and Immunology: www.aaaai.org 2. Food Allergy Research and Education (FARE): foodallergy.org 3. Mothers of Asthmatics: http://www.asthmacommunitynetwork.org 4. American College of Allergy, Asthma, and Immunology: www.acaai.org

## 2016-06-25 NOTE — Progress Notes (Addendum)
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 Chronic allergic rhinitis - Previous testing has been positive to the following (2012 SPT and 2014 sIgE): grasses, weeds, trees, dust mites, cat, dog, mixed feathers  - Molds have been consistent negative both on SPT and sIgE panels. - I am unsure whether we will need to start from the very beginning or father along with regards to vials since he has been on shots for quite some time. - However, we do not know how strong his vials have been since the IT prescription does not include volumes. - Will discuss with my colleagues and get their opinions on the matter.  - Immunotherapy Consent signed today.  - We do not need to do repeat skin testing at this time. - Make an appointment in one month for your first allergy injection.  - This will give you time to use up your vials at Dr. Janee Morn office.  2. Exercise-induced bronchospasm - You did have some obstruction on your lung testing today. - Albuterol did not seem to make a big difference. - Continue with Singulair 63m daily. - He does have a noticeable difference in his symptoms while on Singulair.  - Start ProAir RespiClick two puffs 10 minutes prior to physical activity. - There is no indication for a controller medication at this time.   3. Chronic urticaria - Controlled with the antihistamines and Xolair. - Try decreasing your cetirizine as tolerated.  - Will transfer Xolair injections to our clinic.   4. Return in about 4 weeks (around 07/23/2016) for the first allergy injection and an office visit.    Subjective:   TKieth Hartisis a 50y.o. male presenting today for evaluation of  Chief Complaint  Patient presents with  . Allergies  . Asthma  .  TMaximum Reilandhas a history of the following: Patient  Active Problem List   Diagnosis Date Noted  . Acute bronchitis 12/24/2015  . Erectile dysfunction 08/31/2013  . Pain in lower back 08/15/2013  . Pain in joint, shoulder region 04/13/2013  . Allergic urticaria 09/02/2012  . Pharyngitis with viral syndrome 10/10/2011  . 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 HYPOGONADISM 10/20/2010  . INSOMNIA, CHRONIC 07/18/2010  . FATIGUE 07/18/2010  . Seasonal and perennial allergic rhinitis 07/10/2010    History obtained from: chart review and patient.  TKaris Jubawas referred by AWalker Kehr MD.     TSteeleis a 50y.o. male presenting for allergies and asthma. [facility] Dr. Y[facility]namaria Bootsis retiring and establishing care with [facility] He is currently on immunotherapy and has another five weeks of injections there (he is getting weekly shots at maintenance according to the patient, review of his last injection shows that he received 1:10, which is actually out Yellow Vial, right below our maintenance). He just now reached maintenance dosing per the patient. Dr. YAnnamaria Bootsrecommended that he follow with [facility]. They just now added cat and dogs two years ago which seemed to help him significantly. He does feel that his symptoms have improved on the allergy injections.   TZebhas a history of allergic rhinitis and urticaria. He was started five years ago. He has had them consistently since then. He did have reactions including large local reactions, therefore he was backed down  quite a bit and slowly advanced. They were planning on continuing him on the same regimen, but patient is unsure how long they were planning on continuing in total. His current prescription unfortunately has nothing regarding allergen content such as specific volumes:     He was last tested via blood three years ago (results below) and he was skin tested last in February 2012 (screen shot below):    From a medication  standpoint, he has tried nasal steroids including most recently Flonase, but he tends to get headaches. He is currently on Nasacort one spray per nostril twice daily. He was on Dymista at some point, but it was only a sample for one month. He takes cetirizine 20m up to four times daily as well as Zantac for the hives. He has never tried decreasing his cetirizine use, but honestly has felt rather good off of the cetirizine for the past four days in anticipation of skin testing today. He has not had hives in three years or so. He is on Xolair currently for chronic urticaria which seems to have been started around May 2014.   Asthma/Respiratory Symptom History: He does not think that he has asthma. He has been told that he might develop "more obvious symptoms over time". He has exercise induced bronchospasm but has never had a rescue inhaler. He was started on Singulair 178mwhich does help his breathing. He takes this every day. He has never been tested for reversibility. He denies nighttime coughing. He does not need prednisone.   He is not concerned with food allergies at all. Otherwise, there is no history of other atopic diseases, including asthma, drug allergies, food allergies, or stinging insect allergies. There is no significant infectious history. Vaccinations are up to date.    Past Medical History: Patient Active Problem List   Diagnosis Date Noted  . Acute bronchitis 12/24/2015  . Erectile dysfunction 08/31/2013  . Pain in lower back 08/15/2013  . Pain in joint, shoulder region 04/13/2013  . Allergic urticaria 09/02/2012  . Pharyngitis with viral syndrome 10/10/2011  . 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 HYPOGONADISM 10/20/2010  . INSOMNIA, CHRONIC 07/18/2010  . FATIGUE 07/18/2010  . Seasonal and perennial allergic rhinitis 07/10/2010    Medication List:    Medication List       Accurate as of 06/25/16  1:44 PM. Always use your most recent med list.          aspirin 81  MG chewable tablet Commonly known as:  ASPIRIN CHILDRENS Chew 1 tablet (81 mg total) by mouth daily.   Azelastine-Fluticasone 137-50 MCG/ACT Susp Commonly known as:  DYMISTA Place 2 sprays into both nostrils at bedtime.   cetirizine 10 MG tablet Commonly known as:  ZYRTEC Take 10 mg by mouth 2 (two) times daily.   diphenhydrAMINE 25 mg capsule Commonly known as:  BENADRYL Take 25 mg by mouth at bedtime as needed.   EPINEPHrine 0.3 mg/0.3 mL Soaj injection Commonly known as:  EPI-PEN Inject into thigh if needed for severe allergic reaction   montelukast 10 MG tablet Commonly known as:  SINGULAIR Take 1 tablet (10 mg total) by mouth at bedtime.   NONFORMULARY OR COMPOUNDED ITEM Allergy Vaccine AB 1:10/ C 1:50 [facility]   pseudoephedrine 30 MG tablet Commonly known as:  SUDAFED Take 30-60 mg by mouth 3 (three) times daily as needed. For congestion   temazepam 15 MG capsule Commonly known as:  RESTORIL Take 1-2 capsules (15-30 mg total) by  mouth at bedtime as needed for sleep.       Birth History: non-contributory. Born at term without complications.   Developmental History: Arav has met all milestones on time. He has required no speech therapy, occupational therapy, or physical therapy.   Past Surgical History: Past Surgical History:  Procedure Laterality Date  . CYSTECTOMY    . ORCHIECTOMY    . TESTICLE TORSION REDUCTION     Left      Family History: Family History  Problem Relation Age of Onset  . Coronary artery disease Mother     Dorothyann Peng  . Heart disease Mother     quad bypass  . Hypertension Mother   . Allergic rhinitis Mother   . Alcohol abuse Father   . Heart disease Paternal Grandfather     MI  . Allergies    . Heart attack      Grandmother  . Stroke      Grandmother     Social History: Edna lives at home by himself. He lives in a house that is 50 years old. There is carpeting throughout the home. There are cats  and dogs inside the home. He does have dust mite covers on his bed but not his pillows. There is no tobacco smoke exposure. 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 Then he took a boot camp for [employer] to further his education and is now working at [employer] in [place].    Review of Systems: a 14-point review of systems is pertinent for what is mentioned in HPI.  Otherwise, all other systems were negative. Constitutional: negative other than that listed in the HPI Eyes: negative other than that listed in the HPI Ears, nose, mouth, throat, and face: negative other than that listed in the HPI Respiratory: negative other than that listed in the HPI Cardiovascular: negative other than that listed in the HPI Gastrointestinal: negative other than that listed in the HPI Genitourinary: negative other than that listed in the HPI Integument: negative other than that listed in the HPI Hematologic: negative other than that listed in the HPI Musculoskeletal: negative other than that listed in the HPI Neurological: negative other than that listed in the HPI Allergy/Immunologic: negative other than that listed in the HPI    Objective:   Blood pressure 120/80, pulse 80, temperature 98.2 F (36.8 C), temperature source Oral, height 5' 7.5" (1.715 m), weight 221 lb 9.6 oz (100.5 kg), SpO2 96 %. Body mass index is 34.2 kg/m.   Physical Exam:  General: Alert, interactive, in no acute distress. Very pleasant male. Cooperative with the exam.  HEENT: TMs pearly gray, turbinates edematous and pale without discharge, post-pharynx erythematous. Neck: Supple without thyromegaly. Adenopathy: no enlarged lymph nodes appreciated in the anterior cervical, occipital, axillary, epitrochlear, inguinal, or popliteal regions Lungs: Clear to auscultation without wheezing, rhonchi or rales. No increased work of breathing. CV: Normal S1/S2, no murmurs. Capillary refill <2  seconds.  Abdomen: Nondistended, nontender. No guarding or rebound tenderness. Bowel sounds faint and hyperactive  Skin: Warm and dry, without lesions or rashes. No urticarial lesions noted today.  Extremities:  No clubbing, cyanosis or edema. Neuro:   Grossly intact.  Diagnostic studies:  Spirometry: results abnormal (FEV1: 3.12/94%, FVC: 4.59/113%, FEV1/FVC: 67%).    Spirometry consistent with mild obstructive disease. Albuterol nebulizer treatment given in clinic with no improvement.  Allergy Studies: None     Salvatore Marvel, MD [facility]

## 2016-06-26 ENCOUNTER — Ambulatory Visit (INDEPENDENT_AMBULATORY_CARE_PROVIDER_SITE_OTHER): Payer: BLUE CROSS/BLUE SHIELD | Admitting: *Deleted

## 2016-06-26 DIAGNOSIS — J309 Allergic rhinitis, unspecified: Secondary | ICD-10-CM | POA: Diagnosis not present

## 2016-06-26 NOTE — Progress Notes (Signed)
I discussed Mr. Jose Mahoney's case with my colleague - Dr. Delorse LekPadgett. She and I both feel that we need to reorder the immunotherapy and restart from the beginning since we have no idea of the concentration of extracts or volumes of extracts used in Dr. Roxy CedarYoung's allergy injections. I will have the nursing team call and ensure that he has an up-to-date epinephrine auto-injector. I will also ask them to let him know of the plan.  Jose BondsJoel Gallagher, MD FAAAAI [facility]

## 2016-06-26 NOTE — Addendum Note (Signed)
Addended by: Alfonse SpruceGALLAGHER, Titan Karner LOUIS on: 06/26/2016 01:52 PM   Modules accepted: Orders

## 2016-06-29 ENCOUNTER — Telehealth: Payer: Self-pay | Admitting: *Deleted

## 2016-06-29 NOTE — Telephone Encounter (Signed)
-----   Message from Alfonse SpruceJoel Louis Gallagher, MD sent at 06/26/2016  1:52 PM EDT ----- Could someone call [name] to (1) let him know of our plan regarding allergy immunotherapy and (2) ensure that he has an up to date epinephrine? [facility] if he is worried about the cost. Thanks!

## 2016-07-01 ENCOUNTER — Ambulatory Visit (INDEPENDENT_AMBULATORY_CARE_PROVIDER_SITE_OTHER): Payer: BLUE CROSS/BLUE SHIELD | Admitting: *Deleted

## 2016-07-01 DIAGNOSIS — J309 Allergic rhinitis, unspecified: Secondary | ICD-10-CM

## 2016-07-07 NOTE — Telephone Encounter (Signed)
Left message on machine for call back.

## 2016-07-08 ENCOUNTER — Ambulatory Visit (INDEPENDENT_AMBULATORY_CARE_PROVIDER_SITE_OTHER): Payer: BLUE CROSS/BLUE SHIELD | Admitting: *Deleted

## 2016-07-08 DIAGNOSIS — J309 Allergic rhinitis, unspecified: Secondary | ICD-10-CM | POA: Diagnosis not present

## 2016-07-08 NOTE — Telephone Encounter (Signed)
Start injection appointment made.

## 2016-07-15 ENCOUNTER — Ambulatory Visit (INDEPENDENT_AMBULATORY_CARE_PROVIDER_SITE_OTHER): Payer: BLUE CROSS/BLUE SHIELD | Admitting: *Deleted

## 2016-07-15 DIAGNOSIS — J309 Allergic rhinitis, unspecified: Secondary | ICD-10-CM | POA: Diagnosis not present

## 2016-07-16 DIAGNOSIS — J301 Allergic rhinitis due to pollen: Secondary | ICD-10-CM | POA: Diagnosis not present

## 2016-07-16 NOTE — Progress Notes (Signed)
Vials made 07-16-16.  jm

## 2016-07-17 DIAGNOSIS — J3089 Other allergic rhinitis: Secondary | ICD-10-CM | POA: Diagnosis not present

## 2016-07-22 ENCOUNTER — Ambulatory Visit (INDEPENDENT_AMBULATORY_CARE_PROVIDER_SITE_OTHER): Payer: BLUE CROSS/BLUE SHIELD | Admitting: *Deleted

## 2016-07-22 DIAGNOSIS — J309 Allergic rhinitis, unspecified: Secondary | ICD-10-CM

## 2016-07-30 ENCOUNTER — Ambulatory Visit (INDEPENDENT_AMBULATORY_CARE_PROVIDER_SITE_OTHER): Payer: BLUE CROSS/BLUE SHIELD

## 2016-07-30 ENCOUNTER — Ambulatory Visit: Payer: BLUE CROSS/BLUE SHIELD

## 2016-07-30 DIAGNOSIS — J3081 Allergic rhinitis due to animal (cat) (dog) hair and dander: Secondary | ICD-10-CM

## 2016-08-05 ENCOUNTER — Ambulatory Visit (INDEPENDENT_AMBULATORY_CARE_PROVIDER_SITE_OTHER): Payer: BLUE CROSS/BLUE SHIELD | Admitting: *Deleted

## 2016-08-05 DIAGNOSIS — J309 Allergic rhinitis, unspecified: Secondary | ICD-10-CM | POA: Diagnosis not present

## 2016-08-12 ENCOUNTER — Ambulatory Visit (INDEPENDENT_AMBULATORY_CARE_PROVIDER_SITE_OTHER): Payer: BLUE CROSS/BLUE SHIELD

## 2016-08-12 DIAGNOSIS — J309 Allergic rhinitis, unspecified: Secondary | ICD-10-CM | POA: Diagnosis not present

## 2016-08-19 ENCOUNTER — Ambulatory Visit (INDEPENDENT_AMBULATORY_CARE_PROVIDER_SITE_OTHER): Payer: BLUE CROSS/BLUE SHIELD

## 2016-08-19 DIAGNOSIS — J309 Allergic rhinitis, unspecified: Secondary | ICD-10-CM | POA: Diagnosis not present

## 2016-08-26 ENCOUNTER — Ambulatory Visit (INDEPENDENT_AMBULATORY_CARE_PROVIDER_SITE_OTHER): Payer: BLUE CROSS/BLUE SHIELD | Admitting: *Deleted

## 2016-08-26 DIAGNOSIS — J309 Allergic rhinitis, unspecified: Secondary | ICD-10-CM | POA: Diagnosis not present

## 2016-09-02 ENCOUNTER — Ambulatory Visit (INDEPENDENT_AMBULATORY_CARE_PROVIDER_SITE_OTHER): Payer: BLUE CROSS/BLUE SHIELD

## 2016-09-02 DIAGNOSIS — J309 Allergic rhinitis, unspecified: Secondary | ICD-10-CM | POA: Diagnosis not present

## 2016-09-08 ENCOUNTER — Telehealth: Payer: Self-pay | Admitting: Internal Medicine

## 2016-09-09 ENCOUNTER — Ambulatory Visit (INDEPENDENT_AMBULATORY_CARE_PROVIDER_SITE_OTHER): Payer: BLUE CROSS/BLUE SHIELD

## 2016-09-09 DIAGNOSIS — J309 Allergic rhinitis, unspecified: Secondary | ICD-10-CM | POA: Diagnosis not present

## 2016-09-09 NOTE — Telephone Encounter (Signed)
Patient is returning phone call.  °

## 2016-09-09 NOTE — Telephone Encounter (Signed)
Called and spoke to pt. Pt states [facility].   Will forward to Katie to follow up on.

## 2016-09-11 NOTE — Telephone Encounter (Signed)
Patient returned Katie's call, CB is [phone].

## 2016-09-11 NOTE — Telephone Encounter (Signed)
LMTCB-will need to call back and let us know if he would like [facility] or stop all together for now and resume shots [facility] if needed again. Thanks.

## 2016-09-11 NOTE — Telephone Encounter (Signed)
Spoke with patient-states he has been getting allergy vaccine through [facility] and we can discard our vaccine. Nothing more needed at this time.

## 2016-09-23 ENCOUNTER — Ambulatory Visit (INDEPENDENT_AMBULATORY_CARE_PROVIDER_SITE_OTHER): Payer: BLUE CROSS/BLUE SHIELD

## 2016-09-23 DIAGNOSIS — J309 Allergic rhinitis, unspecified: Secondary | ICD-10-CM | POA: Diagnosis not present

## 2016-09-30 ENCOUNTER — Ambulatory Visit (INDEPENDENT_AMBULATORY_CARE_PROVIDER_SITE_OTHER): Payer: BLUE CROSS/BLUE SHIELD | Admitting: *Deleted

## 2016-09-30 DIAGNOSIS — J309 Allergic rhinitis, unspecified: Secondary | ICD-10-CM

## 2016-10-07 ENCOUNTER — Ambulatory Visit (INDEPENDENT_AMBULATORY_CARE_PROVIDER_SITE_OTHER): Payer: BLUE CROSS/BLUE SHIELD | Admitting: *Deleted

## 2016-10-07 DIAGNOSIS — J309 Allergic rhinitis, unspecified: Secondary | ICD-10-CM | POA: Diagnosis not present

## 2016-10-14 ENCOUNTER — Ambulatory Visit (INDEPENDENT_AMBULATORY_CARE_PROVIDER_SITE_OTHER): Payer: BLUE CROSS/BLUE SHIELD

## 2016-10-14 DIAGNOSIS — J309 Allergic rhinitis, unspecified: Secondary | ICD-10-CM

## 2016-10-28 ENCOUNTER — Ambulatory Visit (INDEPENDENT_AMBULATORY_CARE_PROVIDER_SITE_OTHER): Payer: BLUE CROSS/BLUE SHIELD

## 2016-10-28 DIAGNOSIS — J309 Allergic rhinitis, unspecified: Secondary | ICD-10-CM | POA: Diagnosis not present

## 2016-11-04 ENCOUNTER — Ambulatory Visit (INDEPENDENT_AMBULATORY_CARE_PROVIDER_SITE_OTHER): Payer: BLUE CROSS/BLUE SHIELD | Admitting: *Deleted

## 2016-11-04 DIAGNOSIS — J309 Allergic rhinitis, unspecified: Secondary | ICD-10-CM

## 2016-11-11 ENCOUNTER — Ambulatory Visit (INDEPENDENT_AMBULATORY_CARE_PROVIDER_SITE_OTHER): Payer: BLUE CROSS/BLUE SHIELD

## 2016-11-11 DIAGNOSIS — J309 Allergic rhinitis, unspecified: Secondary | ICD-10-CM

## 2016-11-18 ENCOUNTER — Ambulatory Visit (INDEPENDENT_AMBULATORY_CARE_PROVIDER_SITE_OTHER): Payer: BLUE CROSS/BLUE SHIELD

## 2016-11-18 DIAGNOSIS — J309 Allergic rhinitis, unspecified: Secondary | ICD-10-CM | POA: Diagnosis not present

## 2016-11-25 ENCOUNTER — Ambulatory Visit (INDEPENDENT_AMBULATORY_CARE_PROVIDER_SITE_OTHER): Payer: BLUE CROSS/BLUE SHIELD

## 2016-11-25 DIAGNOSIS — J309 Allergic rhinitis, unspecified: Secondary | ICD-10-CM

## 2016-12-01 ENCOUNTER — Encounter: Payer: Self-pay | Admitting: Allergy and Immunology

## 2016-12-01 ENCOUNTER — Ambulatory Visit (INDEPENDENT_AMBULATORY_CARE_PROVIDER_SITE_OTHER): Payer: BLUE CROSS/BLUE SHIELD | Admitting: Allergy and Immunology

## 2016-12-01 VITALS — BP 124/82 | HR 68 | Resp 16

## 2016-12-01 DIAGNOSIS — L508 Other urticaria: Secondary | ICD-10-CM | POA: Diagnosis not present

## 2016-12-01 DIAGNOSIS — J45990 Exercise induced bronchospasm: Secondary | ICD-10-CM | POA: Diagnosis not present

## 2016-12-01 DIAGNOSIS — J309 Allergic rhinitis, unspecified: Secondary | ICD-10-CM | POA: Diagnosis not present

## 2016-12-01 MED ORDER — MONTELUKAST SODIUM 10 MG PO TABS: 10.0000 mg | ORAL_TABLET | Freq: Every day | ORAL | 3 refills | Status: DC

## 2016-12-01 MED ORDER — IPRATROPIUM BROMIDE 0.06 % NA SOLN: 2.0000 | Freq: Four times a day (QID) | NASAL | 5 refills | Status: DC

## 2016-12-01 NOTE — Progress Notes (Signed)
Follow-up Note  Referring Provider: Tresa Garter, MD Primary Provider: Sonda Primes, MD Date of Office Visit: 12/01/2016  Subjective:   Jose Mahoney (DOB: [birth year]) is a 51 y.o. male who [facility] in re-evaluation of the following:  HPI: Jose Mahoney returns to this clinic in reevaluation of his allergic rhinitis and history of exercise-induced bronchospasm and chronic urticaria. He was last seen in this clinic October 2017 at which point in time he was converted from immunotherapy prescribed by Dr. Maple [facility] to immunotherapy prescribed by Dr. Dellis Anes.  There was a communication issue regarding his immunotherapy. Tim was under the impression that he would be starting the exact same extract that was prescribed by Dr. Maple [facility] but there was a difficult time extracting information that was necessary to mix the exact prescription used by Dr. Maple Hudson and Dr. Dellis Anes made up a different prescription and he had to start at a relatively low dose of extract concentration. Tim was not aware of this and he was somewhat perplexed that we had to use this system but after having him discuss this issue with the office manager he felt much more comfortable about how things were arranged.  Jose Mahoney has these "allergy attacks" where he has nasal congestion followed by unrelenting rhinorrhea that goes on for 1 day. He's had two these attacks one in February and one in March of this year. He did restart his Nasacort after his most recent attack. Otherwise, his respiratory tract has been doing quite well and he had no problems with his upper or lower airways and does not use a short acting bronchodilator and his chronic urticaria is under excellent control as long as he continues to use relatively high dose zyrtec up to 40 mg a day.  Allergies as of 12/01/2016   No Known Allergies     Medication List      Albuterol Sulfate 108 (90 Base) MCG/ACT Aepb Commonly known  as:  PROAIR RESPICLICK Inhale 1 Inhaler into the lungs daily.   cetirizine 10 MG tablet Commonly known as:  ZYRTEC Take 10 mg by mouth 2 (two) times daily.   diphenhydrAMINE 25 mg capsule Commonly known as:  BENADRYL Take 25 mg by mouth at bedtime as needed.   EPINEPHrine 0.3 mg/0.3 mL Soaj injection Commonly known as:  EPI-PEN Inject into thigh if needed for severe allergic reaction   montelukast 10 MG tablet Commonly known as:  SINGULAIR Take 1 tablet (10 mg total) by mouth at bedtime.   NONFORMULARY OR COMPOUNDED ITEM Allergy Vaccine AB 1:10/ C 1:50 Given at [facility]   pseudoephedrine 30 MG tablet Commonly known as:  SUDAFED Take 30-60 mg by mouth 3 (three) times daily as needed. For congestion   temazepam 15 MG capsule Commonly known as:  RESTORIL Take 1-2 capsules (15-30 mg total) by mouth at bedtime as needed for sleep.       Past Medical History:  Diagnosis Date  . Allergic rhinitis   . Asthma   . Chronic insomnia   . Male hypogonadism   . Neck pain   . Recurrent upper respiratory infection (URI)   . Urticaria     Past Surgical History:  Procedure Laterality Date  . CYSTECTOMY    . ORCHIECTOMY    . TESTICLE TORSION REDUCTION     Left     Review of systems negative except as noted in HPI / PMHx or noted below:  Review of Systems  Constitutional:  Negative.   HENT: Negative.   Eyes: Negative.   Respiratory: Negative.   Cardiovascular: Negative.   Gastrointestinal: Negative.   Genitourinary: Negative.   Musculoskeletal: Negative.   Skin: Negative.   Neurological: Negative.   Endo/Heme/Allergies: Negative.   Psychiatric/Behavioral: Negative.      Objective:   Vitals:   12/01/16 1722  BP: 124/82  Pulse: 68  Resp: 16          Physical Exam  Constitutional: He is well-developed, well-nourished, and in no distress.  HENT:  Head: Normocephalic.  Right Ear: Tympanic membrane, external ear and ear canal normal.  Left Ear:  Tympanic membrane, external ear and ear canal normal.  Nose: Nose normal. No mucosal edema or rhinorrhea.  Mouth/Throat: Uvula is midline, oropharynx is clear and moist and mucous membranes are normal. No oropharyngeal exudate.  Eyes: Conjunctivae are normal.  Neck: Trachea normal. No tracheal tenderness present. No tracheal deviation present. No thyromegaly present.  Cardiovascular: S1 normal and S2 normal.   Pulmonary/Chest: No stridor.  Lymphadenopathy:       Head (right side): No tonsillar adenopathy present.       Head (left side): No tonsillar adenopathy present.    He has no cervical adenopathy.  Neurological: He is alert. Gait normal.  Skin: He is not diaphoretic. Nails show no clubbing.  Psychiatric: Mood and affect normal.    Diagnostics: none   Assessment and Plan:   1. Allergic rhinitis, unspecified chronicity, unspecified seasonality, unspecified trigger   2. Chronic urticaria   3. Exercise-induced bronchospasm     1. Can try nasal ipratropium 0.06% 2 sprays each nostril up to 4 times per day to dry up nose  2. Consistently use Nasacort 1-2 sprays each nostril one time per day to prevent significant inflammation from developing and nose  3. Continue montelukast 10 mg tablet 1 time per day. Refills  4. Continue Zyrtec up to 40 mg daily  5. Continue albuterol MDI if needed  6. Continue immunotherapy and EpiPen  7. Contact clinic if problems is moving forward  8. Follow-up with previously arranged clinic appointment  Tim will continue to use immunotherapy and his anti-inflammatory agents for his respiratory tract and urticaria as noted above and he has the option of using nasal ipratropium to dry up his intermittent bouts of rhinorrhea. If there is a problem he'll contact this clinic but otherwise he will return to this clinic for his previously arranged clinic appointment.  Laurette Schimke, MD Allergy / Immunology [facility]

## 2016-12-01 NOTE — Patient Instructions (Addendum)
  1. Can try nasal ipratropium 0.06% 2 sprays each nostril up to 4 times per day to dry up nose  2. Consistently use Nasacort 1-2 sprays each nostril one time per day to prevent significant inflammation from developing and nose  3. Continue montelukast 10 mg tablet 1 time per day. Refills  4. Continue Zyrtec up to 40 mg daily  5. Continue albuterol MDI if needed  6. Continue immunotherapy and EpiPen  7. Contact clinic if problems is moving forward  8. Follow-up with previously arranged clinic appointment

## 2016-12-09 ENCOUNTER — Ambulatory Visit (INDEPENDENT_AMBULATORY_CARE_PROVIDER_SITE_OTHER): Payer: BLUE CROSS/BLUE SHIELD | Admitting: *Deleted

## 2016-12-09 DIAGNOSIS — J309 Allergic rhinitis, unspecified: Secondary | ICD-10-CM | POA: Diagnosis not present

## 2016-12-16 ENCOUNTER — Ambulatory Visit (INDEPENDENT_AMBULATORY_CARE_PROVIDER_SITE_OTHER): Payer: BLUE CROSS/BLUE SHIELD

## 2016-12-16 DIAGNOSIS — J309 Allergic rhinitis, unspecified: Secondary | ICD-10-CM | POA: Diagnosis not present

## 2016-12-23 ENCOUNTER — Ambulatory Visit (INDEPENDENT_AMBULATORY_CARE_PROVIDER_SITE_OTHER): Payer: BLUE CROSS/BLUE SHIELD | Admitting: *Deleted

## 2016-12-23 DIAGNOSIS — J309 Allergic rhinitis, unspecified: Secondary | ICD-10-CM | POA: Diagnosis not present

## 2016-12-24 ENCOUNTER — Other Ambulatory Visit: Payer: Self-pay | Admitting: Internal Medicine

## 2016-12-25 NOTE — Telephone Encounter (Signed)
CY please advise if ok to refill. I can call it in for pt.

## 2016-12-25 NOTE — Telephone Encounter (Signed)
Ok refill total 6 months 

## 2016-12-30 ENCOUNTER — Ambulatory Visit (INDEPENDENT_AMBULATORY_CARE_PROVIDER_SITE_OTHER): Payer: BLUE CROSS/BLUE SHIELD | Admitting: *Deleted

## 2016-12-30 DIAGNOSIS — J309 Allergic rhinitis, unspecified: Secondary | ICD-10-CM | POA: Diagnosis not present

## 2017-01-05 ENCOUNTER — Ambulatory Visit (INDEPENDENT_AMBULATORY_CARE_PROVIDER_SITE_OTHER): Payer: BLUE CROSS/BLUE SHIELD

## 2017-01-05 DIAGNOSIS — J309 Allergic rhinitis, unspecified: Secondary | ICD-10-CM

## 2017-01-13 ENCOUNTER — Ambulatory Visit (INDEPENDENT_AMBULATORY_CARE_PROVIDER_SITE_OTHER): Payer: BLUE CROSS/BLUE SHIELD | Admitting: *Deleted

## 2017-01-13 DIAGNOSIS — J309 Allergic rhinitis, unspecified: Secondary | ICD-10-CM

## 2017-01-19 ENCOUNTER — Ambulatory Visit (INDEPENDENT_AMBULATORY_CARE_PROVIDER_SITE_OTHER): Payer: BLUE CROSS/BLUE SHIELD | Admitting: *Deleted

## 2017-01-19 DIAGNOSIS — J309 Allergic rhinitis, unspecified: Secondary | ICD-10-CM

## 2017-01-26 ENCOUNTER — Ambulatory Visit (INDEPENDENT_AMBULATORY_CARE_PROVIDER_SITE_OTHER): Payer: BLUE CROSS/BLUE SHIELD | Admitting: *Deleted

## 2017-01-26 DIAGNOSIS — J309 Allergic rhinitis, unspecified: Secondary | ICD-10-CM

## 2017-02-02 ENCOUNTER — Ambulatory Visit (INDEPENDENT_AMBULATORY_CARE_PROVIDER_SITE_OTHER): Payer: 59 | Admitting: *Deleted

## 2017-02-02 DIAGNOSIS — J309 Allergic rhinitis, unspecified: Secondary | ICD-10-CM

## 2017-02-09 ENCOUNTER — Ambulatory Visit (INDEPENDENT_AMBULATORY_CARE_PROVIDER_SITE_OTHER): Payer: 59 | Admitting: *Deleted

## 2017-02-09 DIAGNOSIS — J309 Allergic rhinitis, unspecified: Secondary | ICD-10-CM

## 2017-02-16 ENCOUNTER — Ambulatory Visit (INDEPENDENT_AMBULATORY_CARE_PROVIDER_SITE_OTHER): Payer: 59 | Admitting: *Deleted

## 2017-02-16 DIAGNOSIS — J309 Allergic rhinitis, unspecified: Secondary | ICD-10-CM | POA: Diagnosis not present

## 2017-02-23 ENCOUNTER — Ambulatory Visit (INDEPENDENT_AMBULATORY_CARE_PROVIDER_SITE_OTHER): Payer: 59 | Admitting: *Deleted

## 2017-02-23 DIAGNOSIS — J309 Allergic rhinitis, unspecified: Secondary | ICD-10-CM | POA: Diagnosis not present

## 2017-02-26 DIAGNOSIS — J301 Allergic rhinitis due to pollen: Secondary | ICD-10-CM | POA: Diagnosis not present

## 2017-03-02 ENCOUNTER — Ambulatory Visit (INDEPENDENT_AMBULATORY_CARE_PROVIDER_SITE_OTHER): Payer: 59 | Admitting: *Deleted

## 2017-03-02 DIAGNOSIS — J309 Allergic rhinitis, unspecified: Secondary | ICD-10-CM

## 2017-03-09 ENCOUNTER — Ambulatory Visit (INDEPENDENT_AMBULATORY_CARE_PROVIDER_SITE_OTHER): Payer: 59

## 2017-03-09 DIAGNOSIS — J309 Allergic rhinitis, unspecified: Secondary | ICD-10-CM

## 2017-03-16 ENCOUNTER — Ambulatory Visit (INDEPENDENT_AMBULATORY_CARE_PROVIDER_SITE_OTHER): Payer: 59 | Admitting: *Deleted

## 2017-03-16 DIAGNOSIS — J309 Allergic rhinitis, unspecified: Secondary | ICD-10-CM | POA: Diagnosis not present

## 2017-03-23 ENCOUNTER — Ambulatory Visit (INDEPENDENT_AMBULATORY_CARE_PROVIDER_SITE_OTHER): Payer: 59 | Admitting: *Deleted

## 2017-03-23 DIAGNOSIS — J309 Allergic rhinitis, unspecified: Secondary | ICD-10-CM | POA: Diagnosis not present

## 2017-03-30 ENCOUNTER — Ambulatory Visit (INDEPENDENT_AMBULATORY_CARE_PROVIDER_SITE_OTHER): Payer: 59 | Admitting: *Deleted

## 2017-03-30 DIAGNOSIS — J309 Allergic rhinitis, unspecified: Secondary | ICD-10-CM

## 2017-04-06 ENCOUNTER — Ambulatory Visit (INDEPENDENT_AMBULATORY_CARE_PROVIDER_SITE_OTHER): Payer: 59

## 2017-04-06 DIAGNOSIS — J309 Allergic rhinitis, unspecified: Secondary | ICD-10-CM

## 2017-04-07 ENCOUNTER — Other Ambulatory Visit: Payer: Self-pay | Admitting: *Deleted

## 2017-04-07 MED ORDER — MONTELUKAST SODIUM 10 MG PO TABS: 10.0000 mg | ORAL_TABLET | Freq: Every day | ORAL | 2 refills | Status: DC

## 2017-04-13 ENCOUNTER — Ambulatory Visit (INDEPENDENT_AMBULATORY_CARE_PROVIDER_SITE_OTHER): Payer: 59 | Admitting: *Deleted

## 2017-04-13 DIAGNOSIS — J309 Allergic rhinitis, unspecified: Secondary | ICD-10-CM | POA: Diagnosis not present

## 2017-04-20 ENCOUNTER — Ambulatory Visit (INDEPENDENT_AMBULATORY_CARE_PROVIDER_SITE_OTHER): Payer: 59 | Admitting: *Deleted

## 2017-04-20 DIAGNOSIS — J309 Allergic rhinitis, unspecified: Secondary | ICD-10-CM | POA: Diagnosis not present

## 2017-04-27 ENCOUNTER — Ambulatory Visit (INDEPENDENT_AMBULATORY_CARE_PROVIDER_SITE_OTHER): Payer: 59 | Admitting: *Deleted

## 2017-04-27 DIAGNOSIS — J309 Allergic rhinitis, unspecified: Secondary | ICD-10-CM

## 2017-05-04 ENCOUNTER — Ambulatory Visit (INDEPENDENT_AMBULATORY_CARE_PROVIDER_SITE_OTHER): Payer: 59

## 2017-05-04 DIAGNOSIS — J309 Allergic rhinitis, unspecified: Secondary | ICD-10-CM | POA: Diagnosis not present

## 2017-05-06 NOTE — Progress Notes (Signed)
2 MT VIALS MADE EXP. 05/07/18

## 2017-05-07 DIAGNOSIS — J301 Allergic rhinitis due to pollen: Secondary | ICD-10-CM

## 2017-05-11 ENCOUNTER — Ambulatory Visit (INDEPENDENT_AMBULATORY_CARE_PROVIDER_SITE_OTHER): Payer: 59 | Admitting: *Deleted

## 2017-05-11 DIAGNOSIS — J309 Allergic rhinitis, unspecified: Secondary | ICD-10-CM

## 2017-05-18 ENCOUNTER — Ambulatory Visit (INDEPENDENT_AMBULATORY_CARE_PROVIDER_SITE_OTHER): Payer: 59 | Admitting: *Deleted

## 2017-05-18 DIAGNOSIS — J309 Allergic rhinitis, unspecified: Secondary | ICD-10-CM | POA: Diagnosis not present

## 2017-05-25 ENCOUNTER — Ambulatory Visit (INDEPENDENT_AMBULATORY_CARE_PROVIDER_SITE_OTHER): Payer: 59 | Admitting: *Deleted

## 2017-05-25 DIAGNOSIS — J309 Allergic rhinitis, unspecified: Secondary | ICD-10-CM

## 2017-06-01 ENCOUNTER — Ambulatory Visit (INDEPENDENT_AMBULATORY_CARE_PROVIDER_SITE_OTHER): Payer: 59 | Admitting: *Deleted

## 2017-06-01 DIAGNOSIS — J309 Allergic rhinitis, unspecified: Secondary | ICD-10-CM

## 2017-06-08 ENCOUNTER — Ambulatory Visit (INDEPENDENT_AMBULATORY_CARE_PROVIDER_SITE_OTHER): Payer: 59

## 2017-06-08 DIAGNOSIS — J309 Allergic rhinitis, unspecified: Secondary | ICD-10-CM | POA: Diagnosis not present

## 2017-06-15 ENCOUNTER — Ambulatory Visit (INDEPENDENT_AMBULATORY_CARE_PROVIDER_SITE_OTHER): Payer: 59 | Admitting: *Deleted

## 2017-06-15 DIAGNOSIS — J309 Allergic rhinitis, unspecified: Secondary | ICD-10-CM

## 2017-06-22 ENCOUNTER — Ambulatory Visit (INDEPENDENT_AMBULATORY_CARE_PROVIDER_SITE_OTHER): Payer: 59 | Admitting: *Deleted

## 2017-06-22 DIAGNOSIS — J309 Allergic rhinitis, unspecified: Secondary | ICD-10-CM | POA: Diagnosis not present

## 2017-07-06 ENCOUNTER — Ambulatory Visit (INDEPENDENT_AMBULATORY_CARE_PROVIDER_SITE_OTHER): Payer: 59

## 2017-07-06 DIAGNOSIS — J309 Allergic rhinitis, unspecified: Secondary | ICD-10-CM

## 2017-07-20 ENCOUNTER — Ambulatory Visit (INDEPENDENT_AMBULATORY_CARE_PROVIDER_SITE_OTHER): Payer: 59

## 2017-07-20 DIAGNOSIS — J309 Allergic rhinitis, unspecified: Secondary | ICD-10-CM | POA: Diagnosis not present

## 2017-08-03 ENCOUNTER — Ambulatory Visit (INDEPENDENT_AMBULATORY_CARE_PROVIDER_SITE_OTHER): Payer: 59 | Admitting: *Deleted

## 2017-08-03 DIAGNOSIS — J309 Allergic rhinitis, unspecified: Secondary | ICD-10-CM | POA: Diagnosis not present

## 2017-08-17 ENCOUNTER — Ambulatory Visit (INDEPENDENT_AMBULATORY_CARE_PROVIDER_SITE_OTHER): Payer: 59

## 2017-08-17 DIAGNOSIS — J309 Allergic rhinitis, unspecified: Secondary | ICD-10-CM | POA: Diagnosis not present

## 2017-08-18 ENCOUNTER — Encounter: Payer: Self-pay | Admitting: *Deleted

## 2017-08-18 DIAGNOSIS — J301 Allergic rhinitis due to pollen: Secondary | ICD-10-CM | POA: Diagnosis not present

## 2017-08-18 NOTE — Progress Notes (Signed)
VIALS MADE. EXP: 08-18-18. HV 

## 2017-09-01 ENCOUNTER — Ambulatory Visit (INDEPENDENT_AMBULATORY_CARE_PROVIDER_SITE_OTHER): Payer: 59

## 2017-09-01 DIAGNOSIS — J309 Allergic rhinitis, unspecified: Secondary | ICD-10-CM

## 2017-09-14 ENCOUNTER — Ambulatory Visit (INDEPENDENT_AMBULATORY_CARE_PROVIDER_SITE_OTHER): Payer: 59 | Admitting: *Deleted

## 2017-09-14 DIAGNOSIS — J309 Allergic rhinitis, unspecified: Secondary | ICD-10-CM

## 2017-09-28 ENCOUNTER — Ambulatory Visit (INDEPENDENT_AMBULATORY_CARE_PROVIDER_SITE_OTHER): Payer: 59 | Admitting: *Deleted

## 2017-09-28 DIAGNOSIS — J309 Allergic rhinitis, unspecified: Secondary | ICD-10-CM

## 2017-10-05 ENCOUNTER — Ambulatory Visit (INDEPENDENT_AMBULATORY_CARE_PROVIDER_SITE_OTHER): Payer: 59 | Admitting: *Deleted

## 2017-10-05 DIAGNOSIS — J309 Allergic rhinitis, unspecified: Secondary | ICD-10-CM | POA: Diagnosis not present

## 2017-10-12 ENCOUNTER — Ambulatory Visit (INDEPENDENT_AMBULATORY_CARE_PROVIDER_SITE_OTHER): Payer: 59 | Admitting: *Deleted

## 2017-10-12 DIAGNOSIS — J309 Allergic rhinitis, unspecified: Secondary | ICD-10-CM | POA: Diagnosis not present

## 2017-10-18 ENCOUNTER — Ambulatory Visit (INDEPENDENT_AMBULATORY_CARE_PROVIDER_SITE_OTHER): Payer: 59 | Admitting: *Deleted

## 2017-10-18 DIAGNOSIS — J309 Allergic rhinitis, unspecified: Secondary | ICD-10-CM | POA: Diagnosis not present

## 2017-10-29 ENCOUNTER — Ambulatory Visit (INDEPENDENT_AMBULATORY_CARE_PROVIDER_SITE_OTHER): Payer: 59 | Admitting: *Deleted

## 2017-10-29 DIAGNOSIS — J309 Allergic rhinitis, unspecified: Secondary | ICD-10-CM

## 2017-11-09 ENCOUNTER — Ambulatory Visit (INDEPENDENT_AMBULATORY_CARE_PROVIDER_SITE_OTHER): Payer: 59 | Admitting: *Deleted

## 2017-11-09 DIAGNOSIS — J309 Allergic rhinitis, unspecified: Secondary | ICD-10-CM

## 2017-11-18 ENCOUNTER — Telehealth: Payer: Self-pay

## 2017-11-18 NOTE — Telephone Encounter (Signed)
Faxed in 90- day refill for Montelukast 10 mg, no refills, patient needs appointment.

## 2017-11-24 ENCOUNTER — Ambulatory Visit (INDEPENDENT_AMBULATORY_CARE_PROVIDER_SITE_OTHER): Payer: 59 | Admitting: *Deleted

## 2017-11-24 DIAGNOSIS — J309 Allergic rhinitis, unspecified: Secondary | ICD-10-CM | POA: Diagnosis not present

## 2017-12-06 ENCOUNTER — Ambulatory Visit (INDEPENDENT_AMBULATORY_CARE_PROVIDER_SITE_OTHER): Payer: 59

## 2017-12-06 DIAGNOSIS — J309 Allergic rhinitis, unspecified: Secondary | ICD-10-CM | POA: Diagnosis not present

## 2017-12-15 ENCOUNTER — Encounter: Payer: Self-pay | Admitting: *Deleted

## 2017-12-15 NOTE — Progress Notes (Signed)
Maintenance vial made. Exp: 12-16-18. hv 

## 2017-12-17 DIAGNOSIS — J301 Allergic rhinitis due to pollen: Secondary | ICD-10-CM | POA: Diagnosis not present

## 2017-12-21 ENCOUNTER — Ambulatory Visit (INDEPENDENT_AMBULATORY_CARE_PROVIDER_SITE_OTHER): Payer: 59 | Admitting: *Deleted

## 2017-12-21 DIAGNOSIS — J309 Allergic rhinitis, unspecified: Secondary | ICD-10-CM | POA: Diagnosis not present

## 2017-12-31 ENCOUNTER — Ambulatory Visit (INDEPENDENT_AMBULATORY_CARE_PROVIDER_SITE_OTHER): Payer: 59

## 2017-12-31 DIAGNOSIS — J309 Allergic rhinitis, unspecified: Secondary | ICD-10-CM | POA: Diagnosis not present

## 2018-01-11 ENCOUNTER — Ambulatory Visit (INDEPENDENT_AMBULATORY_CARE_PROVIDER_SITE_OTHER): Payer: 59 | Admitting: *Deleted

## 2018-01-11 DIAGNOSIS — J309 Allergic rhinitis, unspecified: Secondary | ICD-10-CM

## 2018-01-20 ENCOUNTER — Ambulatory Visit (INDEPENDENT_AMBULATORY_CARE_PROVIDER_SITE_OTHER): Payer: 59 | Admitting: *Deleted

## 2018-01-20 DIAGNOSIS — J309 Allergic rhinitis, unspecified: Secondary | ICD-10-CM | POA: Diagnosis not present

## 2018-01-26 ENCOUNTER — Ambulatory Visit (INDEPENDENT_AMBULATORY_CARE_PROVIDER_SITE_OTHER): Payer: 59

## 2018-01-26 DIAGNOSIS — J309 Allergic rhinitis, unspecified: Secondary | ICD-10-CM

## 2018-02-01 ENCOUNTER — Ambulatory Visit (INDEPENDENT_AMBULATORY_CARE_PROVIDER_SITE_OTHER): Payer: 59 | Admitting: *Deleted

## 2018-02-01 DIAGNOSIS — J309 Allergic rhinitis, unspecified: Secondary | ICD-10-CM | POA: Diagnosis not present

## 2018-02-08 ENCOUNTER — Ambulatory Visit (INDEPENDENT_AMBULATORY_CARE_PROVIDER_SITE_OTHER): Payer: 59 | Admitting: *Deleted

## 2018-02-08 DIAGNOSIS — J309 Allergic rhinitis, unspecified: Secondary | ICD-10-CM

## 2018-02-22 ENCOUNTER — Ambulatory Visit (INDEPENDENT_AMBULATORY_CARE_PROVIDER_SITE_OTHER): Payer: 59

## 2018-02-22 DIAGNOSIS — J309 Allergic rhinitis, unspecified: Secondary | ICD-10-CM

## 2018-03-08 ENCOUNTER — Ambulatory Visit (INDEPENDENT_AMBULATORY_CARE_PROVIDER_SITE_OTHER): Payer: 59 | Admitting: *Deleted

## 2018-03-08 DIAGNOSIS — J309 Allergic rhinitis, unspecified: Secondary | ICD-10-CM

## 2018-03-22 ENCOUNTER — Ambulatory Visit (INDEPENDENT_AMBULATORY_CARE_PROVIDER_SITE_OTHER): Payer: 59 | Admitting: *Deleted

## 2018-03-22 DIAGNOSIS — J309 Allergic rhinitis, unspecified: Secondary | ICD-10-CM | POA: Diagnosis not present

## 2018-04-05 ENCOUNTER — Ambulatory Visit (INDEPENDENT_AMBULATORY_CARE_PROVIDER_SITE_OTHER): Payer: 59 | Admitting: *Deleted

## 2018-04-05 DIAGNOSIS — J309 Allergic rhinitis, unspecified: Secondary | ICD-10-CM

## 2018-04-13 ENCOUNTER — Encounter: Payer: Self-pay | Admitting: *Deleted

## 2018-04-13 NOTE — Progress Notes (Signed)
Vial made. Exp: 04-14-19. hv 

## 2018-04-15 DIAGNOSIS — J301 Allergic rhinitis due to pollen: Secondary | ICD-10-CM | POA: Diagnosis not present

## 2018-04-19 ENCOUNTER — Ambulatory Visit (INDEPENDENT_AMBULATORY_CARE_PROVIDER_SITE_OTHER): Payer: 59 | Admitting: *Deleted

## 2018-04-19 DIAGNOSIS — J309 Allergic rhinitis, unspecified: Secondary | ICD-10-CM | POA: Diagnosis not present

## 2018-05-03 ENCOUNTER — Ambulatory Visit (INDEPENDENT_AMBULATORY_CARE_PROVIDER_SITE_OTHER): Payer: 59

## 2018-05-03 DIAGNOSIS — J309 Allergic rhinitis, unspecified: Secondary | ICD-10-CM

## 2018-05-10 ENCOUNTER — Ambulatory Visit (INDEPENDENT_AMBULATORY_CARE_PROVIDER_SITE_OTHER): Payer: 59 | Admitting: *Deleted

## 2018-05-10 DIAGNOSIS — J309 Allergic rhinitis, unspecified: Secondary | ICD-10-CM | POA: Diagnosis not present

## 2018-05-17 ENCOUNTER — Ambulatory Visit (INDEPENDENT_AMBULATORY_CARE_PROVIDER_SITE_OTHER): Payer: 59 | Admitting: *Deleted

## 2018-05-17 DIAGNOSIS — J309 Allergic rhinitis, unspecified: Secondary | ICD-10-CM | POA: Diagnosis not present

## 2018-05-24 ENCOUNTER — Ambulatory Visit (INDEPENDENT_AMBULATORY_CARE_PROVIDER_SITE_OTHER): Payer: 59 | Admitting: *Deleted

## 2018-05-24 DIAGNOSIS — J309 Allergic rhinitis, unspecified: Secondary | ICD-10-CM

## 2018-05-31 ENCOUNTER — Ambulatory Visit (INDEPENDENT_AMBULATORY_CARE_PROVIDER_SITE_OTHER): Payer: 59 | Admitting: *Deleted

## 2018-05-31 DIAGNOSIS — J309 Allergic rhinitis, unspecified: Secondary | ICD-10-CM

## 2018-06-14 ENCOUNTER — Ambulatory Visit (INDEPENDENT_AMBULATORY_CARE_PROVIDER_SITE_OTHER): Payer: 59 | Admitting: *Deleted

## 2018-06-14 DIAGNOSIS — J309 Allergic rhinitis, unspecified: Secondary | ICD-10-CM | POA: Diagnosis not present

## 2018-06-28 ENCOUNTER — Ambulatory Visit (INDEPENDENT_AMBULATORY_CARE_PROVIDER_SITE_OTHER): Payer: 59 | Admitting: *Deleted

## 2018-06-28 DIAGNOSIS — J309 Allergic rhinitis, unspecified: Secondary | ICD-10-CM

## 2018-07-12 ENCOUNTER — Ambulatory Visit (INDEPENDENT_AMBULATORY_CARE_PROVIDER_SITE_OTHER): Payer: 59 | Admitting: *Deleted

## 2018-07-12 DIAGNOSIS — J309 Allergic rhinitis, unspecified: Secondary | ICD-10-CM

## 2018-07-19 DIAGNOSIS — J301 Allergic rhinitis due to pollen: Secondary | ICD-10-CM

## 2018-07-19 NOTE — Progress Notes (Signed)
Vials exp 07-21-19 

## 2018-07-26 ENCOUNTER — Ambulatory Visit (INDEPENDENT_AMBULATORY_CARE_PROVIDER_SITE_OTHER): Payer: 59 | Admitting: *Deleted

## 2018-07-26 DIAGNOSIS — J309 Allergic rhinitis, unspecified: Secondary | ICD-10-CM | POA: Diagnosis not present

## 2018-08-09 ENCOUNTER — Ambulatory Visit (INDEPENDENT_AMBULATORY_CARE_PROVIDER_SITE_OTHER): Payer: 59 | Admitting: *Deleted

## 2018-08-09 DIAGNOSIS — J309 Allergic rhinitis, unspecified: Secondary | ICD-10-CM

## 2018-08-22 ENCOUNTER — Ambulatory Visit (INDEPENDENT_AMBULATORY_CARE_PROVIDER_SITE_OTHER): Payer: 59

## 2018-08-22 DIAGNOSIS — J309 Allergic rhinitis, unspecified: Secondary | ICD-10-CM | POA: Diagnosis not present

## 2018-09-07 ENCOUNTER — Ambulatory Visit (INDEPENDENT_AMBULATORY_CARE_PROVIDER_SITE_OTHER): Payer: 59

## 2018-09-07 DIAGNOSIS — J309 Allergic rhinitis, unspecified: Secondary | ICD-10-CM | POA: Diagnosis not present

## 2018-09-13 ENCOUNTER — Ambulatory Visit (INDEPENDENT_AMBULATORY_CARE_PROVIDER_SITE_OTHER): Payer: 59 | Admitting: *Deleted

## 2018-09-13 DIAGNOSIS — J309 Allergic rhinitis, unspecified: Secondary | ICD-10-CM

## 2018-09-20 ENCOUNTER — Ambulatory Visit (INDEPENDENT_AMBULATORY_CARE_PROVIDER_SITE_OTHER): Payer: 59 | Admitting: *Deleted

## 2018-09-20 DIAGNOSIS — J309 Allergic rhinitis, unspecified: Secondary | ICD-10-CM | POA: Diagnosis not present

## 2018-09-27 ENCOUNTER — Ambulatory Visit (INDEPENDENT_AMBULATORY_CARE_PROVIDER_SITE_OTHER): Payer: 59 | Admitting: *Deleted

## 2018-09-27 DIAGNOSIS — J309 Allergic rhinitis, unspecified: Secondary | ICD-10-CM | POA: Diagnosis not present

## 2018-10-05 ENCOUNTER — Ambulatory Visit (INDEPENDENT_AMBULATORY_CARE_PROVIDER_SITE_OTHER): Payer: 59

## 2018-10-05 DIAGNOSIS — J309 Allergic rhinitis, unspecified: Secondary | ICD-10-CM | POA: Diagnosis not present

## 2018-10-17 ENCOUNTER — Ambulatory Visit (INDEPENDENT_AMBULATORY_CARE_PROVIDER_SITE_OTHER): Payer: 59

## 2018-10-17 DIAGNOSIS — J309 Allergic rhinitis, unspecified: Secondary | ICD-10-CM

## 2018-11-01 ENCOUNTER — Ambulatory Visit (INDEPENDENT_AMBULATORY_CARE_PROVIDER_SITE_OTHER): Payer: 59 | Admitting: Allergy and Immunology

## 2018-11-01 ENCOUNTER — Encounter: Payer: Self-pay | Admitting: Allergy and Immunology

## 2018-11-01 VITALS — BP 102/72 | HR 75 | Resp 16

## 2018-11-01 DIAGNOSIS — J3089 Other allergic rhinitis: Secondary | ICD-10-CM | POA: Diagnosis not present

## 2018-11-01 DIAGNOSIS — Z8709 Personal history of other diseases of the respiratory system: Secondary | ICD-10-CM | POA: Diagnosis not present

## 2018-11-01 DIAGNOSIS — L508 Other urticaria: Secondary | ICD-10-CM

## 2018-11-01 MED ORDER — MONTELUKAST SODIUM 10 MG PO TABS: 10.0000 mg | ORAL_TABLET | Freq: Every day | ORAL | 5 refills | Status: DC

## 2018-11-01 MED ORDER — EPINEPHRINE 0.3 MG/0.3ML IJ SOAJ: 0.3000 mg | Freq: Once | INTRAMUSCULAR | 2 refills | Status: AC

## 2018-11-01 NOTE — Patient Instructions (Addendum)
  1. Continue Zyrtec up to 40 mg daily  2. Continue albuterol MDI if needed  3. Continue immunotherapy and EpiPen  4.  Return to clinic in 1 year or earlier if problem

## 2018-11-01 NOTE — Progress Notes (Signed)
[place] - [place] - [place] - [place] - [place]   Follow-up Note  Referring Provider: No ref. provider found Primary Provider: Tresa Garter, MD Date of Office Visit: 11/01/2018  Subjective:   Jose Mahoney (DOB: [birth year]) is a 53 y.o. male who [facility]2018 in re-evaluation of the following:  HPI: Jose Mahoney returns to this clinic in reevaluation of allergic rhinitis and a very distant history of exercise-induced bronchospasm and a history of chronic urticaria.  His last visit to this clinic was 01 December 2016.  Jose Mahoney is undergoing a course of immunotherapy without any adverse effects.  This treatment has resulted in very good control of most of his atopic symptomatology involving his eyes or nose.  He is currently using this form of therapy every 2 weeks.  He no longer has a requirement for montelukast.  He is tapered down his Zyrtec to 20 mg daily.  He did have a rather significant environmental change 2 months ago after ending a long-term relationship with his girlfriend and moving out of the house that had multiple animals located with inside.  He still has exposure to 1 cat at home but he can tell that he is much better regarding all of his atopic symptoms since that change has occurred.  Recently he developed a very bad stye on his eye and he was started on doxycycline and a topical ointment by his ophthalmologist yesterday.  He has not used a short acting bronchodilator in years and can exercise without difficulty and have cold air exposure without any difficulty at this point.  Does not receive the flu vaccine.  Allergies as of 11/01/2018   No Known Allergies     Medication List      Albuterol Sulfate 108 (90 Base) MCG/ACT Aepb Commonly known as:  PROAIR RESPICLICK Inhale 1 Inhaler into the lungs daily.   cetirizine 10 MG tablet Commonly known as:  ZYRTEC Take 10 mg by mouth 2 (two) times daily.   diphenhydrAMINE  25 mg capsule Commonly known as:  BENADRYL Take 25 mg by mouth at bedtime as needed.   EPINEPHrine 0.3 mg/0.3 mL Soaj injection Commonly known as:  EPI-PEN Inject into thigh if needed for severe allergic reaction   estradiol cypionate 5 MG/ML injection Commonly known as:  DEPO-ESTRADIOL Inject 1 mg into the muscle every 7 (seven) days.   montelukast 10 MG tablet Commonly known as:  SINGULAIR Take 1 tablet (10 mg total) by mouth at bedtime.   NONFORMULARY OR COMPOUNDED ITEM Allergy Vaccine AB 1:10/ C 1:50 Given at [facility]   pseudoephedrine 30 MG tablet Commonly known as:  SUDAFED Take 30-60 mg by mouth 3 (three) times daily as needed. For congestion       Past Medical History:  Diagnosis Date  . Allergic rhinitis   . Asthma   . Chronic insomnia   . Male hypogonadism   . Neck pain   . Recurrent upper respiratory infection (URI)   . Urticaria     Past Surgical History:  Procedure Laterality Date  . CYSTECTOMY    . ORCHIECTOMY    . TESTICLE TORSION REDUCTION     Left     Review of systems negative except as noted in HPI / PMHx or noted below:  Review of Systems  Constitutional: Negative.   HENT: Negative.   Eyes: Negative.   Respiratory: Negative.   Cardiovascular: Negative.   Gastrointestinal: Negative.   Genitourinary: Negative.  Musculoskeletal: Negative.   Skin: Negative.   Neurological: Negative.   Endo/Heme/Allergies: Negative.   Psychiatric/Behavioral: Negative.      Objective:   Vitals:   11/01/18 1516  BP: 102/72  Pulse: 75  Resp: 16  SpO2: 97%          Physical Exam Constitutional:      Appearance: He is not diaphoretic.  HENT:     Head: Normocephalic.     Right Ear: Tympanic membrane, ear canal and external ear normal.     Left Ear: Tympanic membrane, ear canal and external ear normal.     Nose: Nose normal. No mucosal edema or rhinorrhea.     Mouth/Throat:     Pharynx: Uvula midline. No oropharyngeal exudate.    Eyes:     Conjunctiva/sclera: Conjunctivae normal.  Neck:     Thyroid: No thyromegaly.     Trachea: Trachea normal. No tracheal tenderness or tracheal deviation.  Cardiovascular:     Rate and Rhythm: Normal rate and regular rhythm.     Heart sounds: Normal heart sounds, S1 normal and S2 normal. No murmur.  Pulmonary:     Effort: No respiratory distress.     Breath sounds: Normal breath sounds. No stridor. No wheezing or rales.  Lymphadenopathy:     Head:     Right side of head: No tonsillar adenopathy.     Left side of head: No tonsillar adenopathy.     Cervical: No cervical adenopathy.  Skin:    Findings: No erythema or rash (Right eyelid stye upper and lower lid).     Nails: There is no clubbing.   Neurological:     Mental Status: He is alert.     Diagnostics:   The patient had an Asthma Control Test with the following results: ACT Total Score: 25.    Assessment and Plan:   1. Other allergic rhinitis   2. Chronic urticaria   3. History of asthma     1. Continue Zyrtec up to 40 mg daily  2. Continue albuterol MDI if needed  3. Continue immunotherapy and EpiPen  4.  Return to clinic in 1 year or earlier if problem   Jose Mahoney has really done very well and he will continue to utilize immunotherapy to control his atopic disease.  I encouraged him to go up to every 3-week administration with his next vial.  I will see him back in this clinic in 1 year or earlier if there is a problem.  Laurette Schimke, MD Allergy / Immunology [facility]

## 2018-11-02 ENCOUNTER — Encounter: Payer: Self-pay | Admitting: Allergy and Immunology

## 2018-11-22 ENCOUNTER — Ambulatory Visit (INDEPENDENT_AMBULATORY_CARE_PROVIDER_SITE_OTHER): Payer: 59 | Admitting: *Deleted

## 2018-11-22 DIAGNOSIS — J3089 Other allergic rhinitis: Secondary | ICD-10-CM

## 2018-11-24 NOTE — Progress Notes (Signed)
VIALS EXP 11-28-2019 

## 2018-11-28 DIAGNOSIS — J301 Allergic rhinitis due to pollen: Secondary | ICD-10-CM

## 2018-12-13 ENCOUNTER — Ambulatory Visit (INDEPENDENT_AMBULATORY_CARE_PROVIDER_SITE_OTHER): Payer: 59

## 2018-12-13 DIAGNOSIS — J309 Allergic rhinitis, unspecified: Secondary | ICD-10-CM

## 2019-01-03 ENCOUNTER — Ambulatory Visit (INDEPENDENT_AMBULATORY_CARE_PROVIDER_SITE_OTHER): Payer: 59

## 2019-01-03 DIAGNOSIS — J309 Allergic rhinitis, unspecified: Secondary | ICD-10-CM | POA: Diagnosis not present

## 2019-01-24 ENCOUNTER — Ambulatory Visit (INDEPENDENT_AMBULATORY_CARE_PROVIDER_SITE_OTHER): Payer: 59 | Admitting: *Deleted

## 2019-01-24 DIAGNOSIS — J309 Allergic rhinitis, unspecified: Secondary | ICD-10-CM | POA: Diagnosis not present

## 2019-01-31 ENCOUNTER — Ambulatory Visit (INDEPENDENT_AMBULATORY_CARE_PROVIDER_SITE_OTHER): Payer: 59 | Admitting: *Deleted

## 2019-01-31 DIAGNOSIS — J309 Allergic rhinitis, unspecified: Secondary | ICD-10-CM

## 2019-02-07 ENCOUNTER — Ambulatory Visit (INDEPENDENT_AMBULATORY_CARE_PROVIDER_SITE_OTHER): Payer: 59 | Admitting: *Deleted

## 2019-02-07 DIAGNOSIS — J309 Allergic rhinitis, unspecified: Secondary | ICD-10-CM | POA: Diagnosis not present

## 2019-02-14 ENCOUNTER — Ambulatory Visit (INDEPENDENT_AMBULATORY_CARE_PROVIDER_SITE_OTHER): Payer: 59 | Admitting: *Deleted

## 2019-02-14 DIAGNOSIS — J309 Allergic rhinitis, unspecified: Secondary | ICD-10-CM

## 2019-02-21 ENCOUNTER — Ambulatory Visit (INDEPENDENT_AMBULATORY_CARE_PROVIDER_SITE_OTHER): Payer: 59 | Admitting: *Deleted

## 2019-02-21 DIAGNOSIS — J309 Allergic rhinitis, unspecified: Secondary | ICD-10-CM | POA: Diagnosis not present

## 2019-03-14 ENCOUNTER — Ambulatory Visit (INDEPENDENT_AMBULATORY_CARE_PROVIDER_SITE_OTHER): Payer: 59 | Admitting: *Deleted

## 2019-03-14 DIAGNOSIS — J309 Allergic rhinitis, unspecified: Secondary | ICD-10-CM

## 2019-04-04 ENCOUNTER — Ambulatory Visit (INDEPENDENT_AMBULATORY_CARE_PROVIDER_SITE_OTHER): Payer: 59 | Admitting: *Deleted

## 2019-04-04 DIAGNOSIS — J309 Allergic rhinitis, unspecified: Secondary | ICD-10-CM | POA: Diagnosis not present

## 2019-04-13 NOTE — Progress Notes (Signed)
Vials exp 04-16-2020 

## 2019-04-17 DIAGNOSIS — J301 Allergic rhinitis due to pollen: Secondary | ICD-10-CM | POA: Diagnosis not present

## 2019-04-25 ENCOUNTER — Ambulatory Visit (INDEPENDENT_AMBULATORY_CARE_PROVIDER_SITE_OTHER): Payer: 59 | Admitting: *Deleted

## 2019-04-25 DIAGNOSIS — J309 Allergic rhinitis, unspecified: Secondary | ICD-10-CM | POA: Diagnosis not present

## 2019-05-16 ENCOUNTER — Ambulatory Visit (INDEPENDENT_AMBULATORY_CARE_PROVIDER_SITE_OTHER): Payer: 59 | Admitting: *Deleted

## 2019-05-16 DIAGNOSIS — J309 Allergic rhinitis, unspecified: Secondary | ICD-10-CM

## 2019-05-26 ENCOUNTER — Ambulatory Visit (INDEPENDENT_AMBULATORY_CARE_PROVIDER_SITE_OTHER): Payer: 59 | Admitting: Internal Medicine

## 2019-05-26 ENCOUNTER — Encounter: Payer: Self-pay | Admitting: Internal Medicine

## 2019-05-26 ENCOUNTER — Other Ambulatory Visit: Payer: Self-pay

## 2019-05-26 DIAGNOSIS — J45998 Other asthma: Secondary | ICD-10-CM

## 2019-05-26 DIAGNOSIS — J302 Other seasonal allergic rhinitis: Secondary | ICD-10-CM | POA: Diagnosis not present

## 2019-05-26 DIAGNOSIS — G4700 Insomnia, unspecified: Secondary | ICD-10-CM | POA: Diagnosis not present

## 2019-05-26 DIAGNOSIS — J3089 Other allergic rhinitis: Secondary | ICD-10-CM

## 2019-05-26 MED ORDER — TEMAZEPAM 15 MG PO CAPS: ORAL_CAPSULE | ORAL | 3 refills | Status: DC

## 2019-05-26 NOTE — Assessment & Plan Note (Signed)
Underlying primary insomnia exacerbated by recent loss of father. Discussed expectations and options. He has past experience with temazepam. At least some snoring. Watch for other clues to possible OSA and consider sleep study if appropriate. Plan- temazepam

## 2019-05-26 NOTE — Progress Notes (Signed)
Patient ID: Jose Mahoney, male    DOB: Jun 17, 1966, 53 y.o.   MRN: [id number]  HPI M former smoker followed for Insomnia, complicated by asthma, allergic rhinitis,urticaria,  Divorced [employer], lives alone with cat. Allergy vacine here in past, now managed by Dr Neldon Mc  -------------------------------------------------------------------------------  01/23/2016-53 year old male former smoker followed for chronic insomnia, allergic rhinitis, urticaria Allergy Vaccine 1:10 [facility] FOLLOWS FOR: Still on vaccine and no reactions. Pt will need new Rx for Epipen sent to [facility].  Still uses occasional temazepam, not often, for insomnia  05/26/2019- 52 yoM former smoker followed for insomnia last seen in 2017, needing to re-establish. Reports getting 3-4 hours of sleep, requesting refill of temazepam or any other recommendations Father died last week and he recently had to put down a dog>>insomnia. Previously followed for allergic rhinitis, urticaria when we provided allergy vaccine program with this practice.  Medical problem list includes asthma, allergic rhinitis, urticaria,  Epworth score 11 Difficulty initiating and maintaining sleep. Worse recently. Had been managing with melatonin, tryptophan. Aware he snores some. Not told of witnessed apnea. EDS related to sleep previous night.  EENT surgery- none. Asthma mainly limited to EIA on treadmill. No cardiac issues. Does not disturb sleep. Had flu vax.  ROS-see HPI    + = positive Constitutional:     weight loss, no- night sweats, fevers, chills, fatigue, lassitude. HEENT:   No-  headaches, difficulty swallowing, tooth/dental problems,  sore throat,       No-  sneezing, itching, ear ache,  congestion, post nasal drip,  CV:  No-   chest pain, orthopnea, PND, swelling in lower extremities, anasarca, dizziness, palpitations Resp: No-   shortness of breath with exertion or at rest.              No-   productive cough,    non-productive cough,  No- coughing up of blood.      No-   change in color of mucus.  No-occasional wheezing.   Skin: HPI GI:  No-   heartburn, indigestion, abdominal pain, nausea, vomiting,  GU:  MS:  No-   joint pain or swelling.   Neuro-     nothing unusual Psych:  No- change in mood or affect. No depression or anxiety.  No memory loss.  Objective:   Physical Exam General- Alert, Oriented, Affect-appropriate, Distress- none acute,  + overweight  Skin- ,excoriation- none, No rash demonstrated at this visit.  Lymphadenopathy- none Head- atraumatic            Eyes- Gross vision intact, PERRLA, conjunctivae clear secretions            Ears- Hearing, canals            Nose-  No-Septal dev, mucus, polyps, erosion, perforation. nasal mucosa + edema Throat- Mallampati III , mucosa-Clear, drainage -none, tonsils- atrophic Neck- flexible , trachea midline, no stridor , thyroid nl, carotid no bruit Chest - symmetrical excursion , unlabored           Heart/CV- RRR , no murmur , no gallop  , no rub, nl s1 s2                           - JVD- none , edema- none, stasis changes- none, varices- none           Lung- clear to P&A, wheeze- none, cough-None , dullness-none, rub- none  Chest wall-  Abd-  Br/ Gen/ Rectal- Not done, not indicated Extrem- cyanosis- none, clubbing, none, atrophy- none, strength- nl Neuro- grossly intact to observation

## 2019-05-26 NOTE — Patient Instructions (Signed)
Script sent for temazepam 15 mg. Try 1 or 2 for sleep.  If this doesn't meet your needs please let me know.

## 2019-05-26 NOTE — Assessment & Plan Note (Signed)
Watch for potential connection to snoring with possible seasonal variation, mouth breathing.

## 2019-05-26 NOTE — Assessment & Plan Note (Signed)
Aged now by his current allergy practice. Without recent concerns.

## 2019-06-06 ENCOUNTER — Ambulatory Visit (INDEPENDENT_AMBULATORY_CARE_PROVIDER_SITE_OTHER): Payer: 59

## 2019-06-06 DIAGNOSIS — J309 Allergic rhinitis, unspecified: Secondary | ICD-10-CM | POA: Diagnosis not present

## 2019-06-27 ENCOUNTER — Ambulatory Visit (INDEPENDENT_AMBULATORY_CARE_PROVIDER_SITE_OTHER): Payer: 59

## 2019-06-27 DIAGNOSIS — J309 Allergic rhinitis, unspecified: Secondary | ICD-10-CM

## 2019-07-04 ENCOUNTER — Ambulatory Visit (INDEPENDENT_AMBULATORY_CARE_PROVIDER_SITE_OTHER): Payer: 59 | Admitting: *Deleted

## 2019-07-04 DIAGNOSIS — J309 Allergic rhinitis, unspecified: Secondary | ICD-10-CM | POA: Diagnosis not present

## 2019-07-11 ENCOUNTER — Ambulatory Visit (INDEPENDENT_AMBULATORY_CARE_PROVIDER_SITE_OTHER): Payer: 59

## 2019-07-11 DIAGNOSIS — J309 Allergic rhinitis, unspecified: Secondary | ICD-10-CM

## 2019-07-18 ENCOUNTER — Ambulatory Visit (INDEPENDENT_AMBULATORY_CARE_PROVIDER_SITE_OTHER): Payer: 59 | Admitting: *Deleted

## 2019-07-18 DIAGNOSIS — J309 Allergic rhinitis, unspecified: Secondary | ICD-10-CM

## 2019-07-20 ENCOUNTER — Other Ambulatory Visit: Payer: Self-pay

## 2019-07-20 MED ORDER — MONTELUKAST SODIUM 10 MG PO TABS: 10.0000 mg | ORAL_TABLET | Freq: Every day | ORAL | 0 refills | Status: DC

## 2019-07-25 ENCOUNTER — Ambulatory Visit (INDEPENDENT_AMBULATORY_CARE_PROVIDER_SITE_OTHER): Payer: 59

## 2019-07-25 DIAGNOSIS — J309 Allergic rhinitis, unspecified: Secondary | ICD-10-CM

## 2019-08-12 ENCOUNTER — Other Ambulatory Visit: Payer: Self-pay | Admitting: Allergy and Immunology

## 2019-08-15 ENCOUNTER — Ambulatory Visit (INDEPENDENT_AMBULATORY_CARE_PROVIDER_SITE_OTHER): Payer: 59

## 2019-08-15 ENCOUNTER — Other Ambulatory Visit: Payer: Self-pay

## 2019-08-15 DIAGNOSIS — J309 Allergic rhinitis, unspecified: Secondary | ICD-10-CM | POA: Diagnosis not present

## 2019-08-15 MED ORDER — MONTELUKAST SODIUM 10 MG PO TABS: 10.0000 mg | ORAL_TABLET | Freq: Every day | ORAL | 3 refills | Status: DC

## 2019-08-16 ENCOUNTER — Other Ambulatory Visit: Payer: Self-pay | Admitting: Allergy and Immunology

## 2019-08-21 ENCOUNTER — Other Ambulatory Visit: Payer: Self-pay

## 2019-08-21 MED ORDER — MONTELUKAST SODIUM 10 MG PO TABS: 10.0000 mg | ORAL_TABLET | Freq: Every day | ORAL | 2 refills | Status: DC

## 2019-09-05 ENCOUNTER — Ambulatory Visit (INDEPENDENT_AMBULATORY_CARE_PROVIDER_SITE_OTHER): Payer: 59

## 2019-09-05 DIAGNOSIS — J309 Allergic rhinitis, unspecified: Secondary | ICD-10-CM | POA: Diagnosis not present

## 2019-09-26 ENCOUNTER — Ambulatory Visit (INDEPENDENT_AMBULATORY_CARE_PROVIDER_SITE_OTHER): Payer: 59

## 2019-09-26 DIAGNOSIS — J309 Allergic rhinitis, unspecified: Secondary | ICD-10-CM | POA: Diagnosis not present

## 2019-09-28 ENCOUNTER — Ambulatory Visit: Payer: 59 | Admitting: Internal Medicine

## 2019-10-17 ENCOUNTER — Ambulatory Visit (INDEPENDENT_AMBULATORY_CARE_PROVIDER_SITE_OTHER): Payer: 59

## 2019-10-17 DIAGNOSIS — J309 Allergic rhinitis, unspecified: Secondary | ICD-10-CM

## 2019-10-24 NOTE — Progress Notes (Signed)
Vials exp 10-23-20 

## 2019-10-25 DIAGNOSIS — J301 Allergic rhinitis due to pollen: Secondary | ICD-10-CM

## 2019-11-07 ENCOUNTER — Ambulatory Visit (INDEPENDENT_AMBULATORY_CARE_PROVIDER_SITE_OTHER): Payer: Self-pay

## 2019-11-07 DIAGNOSIS — J309 Allergic rhinitis, unspecified: Secondary | ICD-10-CM

## 2019-11-10 ENCOUNTER — Ambulatory Visit: Payer: 59 | Admitting: Internal Medicine

## 2019-11-12 ENCOUNTER — Other Ambulatory Visit: Payer: Self-pay | Admitting: Allergy and Immunology

## 2019-11-28 ENCOUNTER — Ambulatory Visit (INDEPENDENT_AMBULATORY_CARE_PROVIDER_SITE_OTHER): Payer: BC Managed Care – PPO | Admitting: Allergy and Immunology

## 2019-11-28 ENCOUNTER — Encounter: Payer: Self-pay | Admitting: Allergy and Immunology

## 2019-11-28 ENCOUNTER — Ambulatory Visit: Payer: Self-pay

## 2019-11-28 ENCOUNTER — Other Ambulatory Visit: Payer: Self-pay

## 2019-11-28 VITALS — BP 130/80 | HR 70 | Temp 97.7°F | Resp 16 | Ht 69.0 in | Wt 224.2 lb

## 2019-11-28 DIAGNOSIS — J309 Allergic rhinitis, unspecified: Secondary | ICD-10-CM

## 2019-11-28 DIAGNOSIS — J3089 Other allergic rhinitis: Secondary | ICD-10-CM

## 2019-11-28 DIAGNOSIS — L508 Other urticaria: Secondary | ICD-10-CM

## 2019-11-28 MED ORDER — MONTELUKAST SODIUM 10 MG PO TABS: 10.0000 mg | ORAL_TABLET | Freq: Every day | ORAL | 0 refills | Status: DC

## 2019-11-28 MED ORDER — EPINEPHRINE 0.3 MG/0.3ML IJ SOAJ: 0.3000 mg | INTRAMUSCULAR | 2 refills | Status: DC | PRN

## 2019-11-28 NOTE — Patient Instructions (Addendum)
  1. Continue Zyrtec up to 40 mg daily  2. Continue montelukast 10 mg daily  3. Continue immunotherapy and EpiPen  4.  Return to clinic in 1 year or earlier if problem       

## 2019-11-28 NOTE — Progress Notes (Signed)
[place] - [place] - [place] - [place] - [place]   Follow-up Note  Referring Provider: Plotnikov, Georgina Quint, MD Primary Provider: Tresa Garter, MD Date of Office Visit: 11/28/2019  Subjective:   Jose Mahoney (DOB: 1966-08-20) is a 54 y.o. male who [facility] 11/28/2019 in re-evaluation of the following:  HPI: [name] returns to this clinic in evaluation of allergic rhinitis treated with immunotherapy and a history of chronic urticaria and a very distant history of asthma.  I last saw him in this clinic on 01 November 2018.  He has really done well with his immunotherapy currently at every 3 weeks without any adverse effect.  This form of treatment has resulted in excellent control of his atopic respiratory and conjunctival disease.  He still continues to use montelukast on most days and Zyrtec on occasion.  He has not been having any issues with itchiness and his distant history of asthma remains inactive without the need for any short acting bronchodilator.  The remaining cat inside the household has passed away 3 weeks ago and at this point he does not have any plans to obtain another cat.  He has obtained the first Moderna Covid vaccination.  Allergies as of 11/28/2019      Reactions   Flonase [fluticasone Propionate] Other (See Comments)   Headaches      Medication List    Albuterol Sulfate 108 (90 Base) MCG/ACT Aepb Commonly known as: ProAir RespiClick Inhale 1 Inhaler into the lungs daily.   anastrozole 1 MG tablet Commonly known as: ARIMIDEX SMARTSIG:0.5 Tablet(s) By Mouth Once a Week   cetirizine 10 MG tablet Commonly known as: ZYRTEC Take 10 mg by mouth 2 (two) times daily.   diphenhydrAMINE 25 mg capsule Commonly known as: BENADRYL Take 25 mg by mouth at bedtime as needed.   EPINEPHrine 0.3 mg/0.3 mL Soaj injection Commonly known as: EPI-PEN Inject into thigh if needed for severe allergic reaction   montelukast  10 MG tablet Commonly known as: SINGULAIR TAKE 1 TABLET BY MOUTH EVERYDAY AT BEDTIME   NONFORMULARY OR COMPOUNDED ITEM Allergy Vaccine AB 1:10/ C 1:50 Given at [facility]   pseudoephedrine 30 MG tablet Commonly known as: SUDAFED Take 30-60 mg by mouth 3 (three) times daily as needed. For congestion   temazepam 15 MG capsule Commonly known as: RESTORIL 1-2 caps for sleep as needed   testosterone cypionate 200 MG/ML injection Commonly known as: DEPOTESTOSTERONE CYPIONATE SMARTSIG:0.4 Milliliter(s) IM Once a Week       Past Medical History:  Diagnosis Date  . Allergic rhinitis   . Asthma   . Chronic insomnia   . Male hypogonadism   . Neck pain   . Recurrent upper respiratory infection (URI)   . Urticaria     Past Surgical History:  Procedure Laterality Date  . CYSTECTOMY    . ORCHIECTOMY    . TESTICLE TORSION REDUCTION     Left     Review of systems negative except as noted in HPI / PMHx or noted below:  Review of Systems  Constitutional: Negative.   HENT: Negative.   Eyes: Negative.   Respiratory: Negative.   Cardiovascular: Negative.   Gastrointestinal: Negative.   Genitourinary: Negative.   Musculoskeletal: Negative.   Skin: Negative.   Neurological: Negative.   Endo/Heme/Allergies: Negative.   Psychiatric/Behavioral: Negative.      Objective:   Vitals:   11/28/19 1022  BP: 130/80  Pulse: 70  Resp: 16  Temp: 97.7 F (36.5 C)  SpO2: 98%   Height: 5\' 9"  (175.3 cm)  Weight: 224 lb 3.2 oz (101.7 kg)   Physical Exam Constitutional:      Appearance: He is not diaphoretic.  HENT:     Head: Normocephalic.     Right Ear: Tympanic membrane, ear canal and external ear normal.     Left Ear: Tympanic membrane, ear canal and external ear normal.     Nose: Nose normal. No mucosal edema or rhinorrhea.     Mouth/Throat:     Pharynx: Uvula midline. No oropharyngeal exudate.  Eyes:     Conjunctiva/sclera: Conjunctivae normal.  Neck:      Thyroid: No thyromegaly.     Trachea: Trachea normal. No tracheal tenderness or tracheal deviation.  Cardiovascular:     Rate and Rhythm: Normal rate and regular rhythm.     Heart sounds: Normal heart sounds, S1 normal and S2 normal. No murmur.  Pulmonary:     Effort: No respiratory distress.     Breath sounds: Normal breath sounds. No stridor. No wheezing or rales.  Lymphadenopathy:     Head:     Right side of head: No tonsillar adenopathy.     Left side of head: No tonsillar adenopathy.     Cervical: No cervical adenopathy.  Skin:    Findings: No erythema or rash.     Nails: There is no clubbing.  Neurological:     Mental Status: He is alert.     Diagnostics: none  Assessment and Plan:   1. Other allergic rhinitis   2. Chronic urticaria     1. Continue Zyrtec up to 40 mg daily  2. Continue montelukast 10 mg daily  3. Continue immunotherapy and EpiPen  4.  Return to clinic in 1 year or earlier if problem   [name] appears to be doing very well and he will continue on immunotherapy.  Sometime this year he will be extending his immunotherapy administration every 4 weeks.  Assuming he continues to do well on the plan noted above regarding control of his atopic respiratory disease and his urticaria I will see him back in this clinic in 1 year or earlier if there is a problem.  Allena Katz, MD Allergy / Immunology [place]

## 2019-11-29 ENCOUNTER — Encounter: Payer: Self-pay | Admitting: Allergy and Immunology

## 2019-12-18 ENCOUNTER — Ambulatory Visit: Payer: Self-pay | Admitting: Internal Medicine

## 2019-12-21 ENCOUNTER — Ambulatory Visit (INDEPENDENT_AMBULATORY_CARE_PROVIDER_SITE_OTHER): Payer: Self-pay | Admitting: Internal Medicine

## 2019-12-21 ENCOUNTER — Other Ambulatory Visit: Payer: Self-pay

## 2019-12-21 ENCOUNTER — Encounter: Payer: Self-pay | Admitting: Internal Medicine

## 2019-12-21 DIAGNOSIS — J3089 Other allergic rhinitis: Secondary | ICD-10-CM

## 2019-12-21 DIAGNOSIS — G4700 Insomnia, unspecified: Secondary | ICD-10-CM

## 2019-12-21 DIAGNOSIS — J302 Other seasonal allergic rhinitis: Secondary | ICD-10-CM

## 2019-12-21 NOTE — Progress Notes (Signed)
Patient ID: Jose Mahoney, male    DOB: March 14, 1966, 54 y.o.   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 Allergy vacine here in past, now managed by Dr Lucie Leather  -------------------------------------------------------------------------------   05/26/2019- 52 yoM former smoker followed for insomnia last seen in 2017, needing to re-establish. Reports getting 3-4 hours of sleep, requesting refill of temazepam or any other recommendations Father died last week and he recently had to put down a dog>>insomnia. Previously followed for allergic rhinitis, urticaria when we provided allergy vaccine program with this practice.  Medical problem list includes asthma, allergic rhinitis, urticaria,  Epworth score 11 Difficulty initiating and maintaining sleep. Worse recently. Had been managing with melatonin, tryptophan. Aware he snores some. Not told of witnessed apnea. EDS related to sleep previous night.  EENT surgery- none. Asthma mainly limited to EIA on treadmill. No cardiac issues. Does not disturb sleep. Had flu vax.  12/21/19- 53 yoM former smoker followed for Insomnia, complicated by  Allergic Rhinitis (Dr Lucie Leather), Asthma,  Urticaria,  Temazepam 15,  ------f/u Chronic  Insomnia. Breathing is at baseline.  2 Covax Usually needs 2 temazepam (= 30 mg). Occasional melatonin discussed. Overall quality of life is well maintained with no concerns. Medication talk done.  He denies interval medical concerns.    ROS-see HPI    + = positive Constitutional:     weight loss, no- night sweats, fevers, chills, fatigue, lassitude. HEENT:   No-  headaches, difficulty swallowing, tooth/dental problems,  sore throat,       No-  sneezing, itching, ear ache,  congestion, post nasal drip,  CV:  No-   chest pain, orthopnea, PND, swelling in lower extremities, anasarca, dizziness,  palpitations Resp: No-   shortness of breath with exertion or at rest.              No-   productive cough,   non-productive cough,  No- coughing up of blood.      No-   change in color of mucus.  No-occasional wheezing.   Skin: HPI GI:  No-   heartburn, indigestion, abdominal pain, nausea, vomiting,  GU:  MS:  No-   joint pain or swelling.   Neuro-     nothing unusual Psych:  No- change in mood or affect. No depression or anxiety.  No memory loss.  Objective:   Physical Exam General- Alert, Oriented, Affect-appropriate, Distress- none acute,  + overweight  Skin- ,excoriation- none, No rash demonstrated at this visit.  Lymphadenopathy- none Head- atraumatic            Eyes- Gross vision intact, PERRLA, conjunctivae clear secretions            Ears- Hearing, canals            Nose-  No-Septal dev, mucus, polyps, erosion, perforation. nasal mucosa + edema Throat- Mallampati III , mucosa-Clear, drainage -none, tonsils- atrophic Neck- flexible , trachea midline, no stridor , thyroid nl, carotid no bruit Chest - symmetrical excursion , unlabored           Heart/CV- RRR , no murmur , no gallop  , no rub, nl s1 s2                           - JVD- none , edema- none, stasis changes- none, varices- none  Lung- clear to P&A, wheeze- none, cough-None , dullness-none, rub- none           Chest wall-  Abd-  Br/ Gen/ Rectal- Not done, not indicated Extrem- cyanosis- none, clubbing, none, atrophy- none, strength- nl Neuro- grossly intact to observation

## 2019-12-21 NOTE — Patient Instructions (Signed)
Ok to continue temazepam 15 mg, 1 or 2 caps, and / or melatonin     Please call if we can help

## 2019-12-26 ENCOUNTER — Ambulatory Visit (INDEPENDENT_AMBULATORY_CARE_PROVIDER_SITE_OTHER): Payer: BC Managed Care – PPO

## 2019-12-26 DIAGNOSIS — J309 Allergic rhinitis, unspecified: Secondary | ICD-10-CM | POA: Diagnosis not present

## 2020-01-02 ENCOUNTER — Ambulatory Visit (INDEPENDENT_AMBULATORY_CARE_PROVIDER_SITE_OTHER): Payer: BC Managed Care – PPO

## 2020-01-02 DIAGNOSIS — J309 Allergic rhinitis, unspecified: Secondary | ICD-10-CM | POA: Diagnosis not present

## 2020-01-09 ENCOUNTER — Ambulatory Visit (INDEPENDENT_AMBULATORY_CARE_PROVIDER_SITE_OTHER): Payer: BC Managed Care – PPO

## 2020-01-09 DIAGNOSIS — J309 Allergic rhinitis, unspecified: Secondary | ICD-10-CM

## 2020-01-16 ENCOUNTER — Ambulatory Visit (INDEPENDENT_AMBULATORY_CARE_PROVIDER_SITE_OTHER): Payer: BC Managed Care – PPO

## 2020-01-16 DIAGNOSIS — J309 Allergic rhinitis, unspecified: Secondary | ICD-10-CM

## 2020-01-23 ENCOUNTER — Ambulatory Visit (INDEPENDENT_AMBULATORY_CARE_PROVIDER_SITE_OTHER): Payer: BC Managed Care – PPO

## 2020-01-23 DIAGNOSIS — J309 Allergic rhinitis, unspecified: Secondary | ICD-10-CM

## 2020-01-23 NOTE — Assessment & Plan Note (Signed)
Nasal congestion fairly well controlled most of the time as he continues working with his allergist. Not interfering with sleep now. Plan- continue with Dr Lucie Leather

## 2020-01-23 NOTE — Assessment & Plan Note (Signed)
Temazepam works well. Balance between restless sleep and morning sedation is understood.  Plan- continue temazepam. Skip doses when able. Melatonin ok.

## 2020-02-20 ENCOUNTER — Ambulatory Visit (INDEPENDENT_AMBULATORY_CARE_PROVIDER_SITE_OTHER): Payer: BC Managed Care – PPO

## 2020-02-20 DIAGNOSIS — J309 Allergic rhinitis, unspecified: Secondary | ICD-10-CM

## 2020-03-19 ENCOUNTER — Ambulatory Visit (INDEPENDENT_AMBULATORY_CARE_PROVIDER_SITE_OTHER): Payer: BC Managed Care – PPO

## 2020-03-19 DIAGNOSIS — J309 Allergic rhinitis, unspecified: Secondary | ICD-10-CM | POA: Diagnosis not present

## 2020-04-16 ENCOUNTER — Ambulatory Visit (INDEPENDENT_AMBULATORY_CARE_PROVIDER_SITE_OTHER): Payer: BC Managed Care – PPO

## 2020-04-16 DIAGNOSIS — J309 Allergic rhinitis, unspecified: Secondary | ICD-10-CM

## 2020-05-09 ENCOUNTER — Telehealth: Payer: Self-pay | Admitting: Internal Medicine

## 2020-05-09 MED ORDER — TEMAZEPAM 15 MG PO CAPS: ORAL_CAPSULE | ORAL | 3 refills | Status: DC

## 2020-05-09 NOTE — Telephone Encounter (Signed)
Temazepam refilled.

## 2020-05-09 NOTE — Telephone Encounter (Signed)
Dr. Maple Hudson, please advise if you are okay refilling pt's temazepam sending it to [facility] on [address] in [place].  Allergies  Allergen Reactions  . Flonase [Fluticasone Propionate] Other (See Comments)    Headaches     Current Outpatient Medications:  .  anastrozole (ARIMIDEX) 1 MG tablet, 1/2 MG a week, Disp: , Rfl:  .  cetirizine (ZYRTEC) 10 MG tablet, Take 10 mg by mouth 2 (two) times daily. , Disp: , Rfl:  .  diphenhydrAMINE (BENADRYL) 25 mg capsule, Take 25 mg by mouth at bedtime as needed.  , Disp: , Rfl:  .  EPINEPHrine 0.3 mg/0.3 mL IJ SOAJ injection, Inject 0.3 mLs (0.3 mg total) into the muscle as needed for anaphylaxis. Inject into thigh if needed for severe allergic reaction, Disp: 2 each, Rfl: 2 .  montelukast (SINGULAIR) 10 MG tablet, Take 1 tablet (10 mg total) by mouth at bedtime., Disp: 90 tablet, Rfl: 0 .  NONFORMULARY OR COMPOUNDED ITEM, Allergy Vaccine AB 1:10/ C 1:50 Given at [facility], Disp: , Rfl:  .  pseudoephedrine (SUDAFED) 30 MG tablet, Take 30-60 mg by mouth 3 (three) times daily as needed. For congestion , Disp: , Rfl:  .  temazepam (RESTORIL) 15 MG capsule, 1-2 caps for sleep as needed, Disp: 60 capsule, Rfl: 3 .  testosterone cypionate (DEPOTESTOSTERONE CYPIONATE) 200 MG/ML injection, SMARTSIG:0.4 Milliliter(s) IM Once a Week, Disp: , Rfl:

## 2020-05-14 ENCOUNTER — Ambulatory Visit (INDEPENDENT_AMBULATORY_CARE_PROVIDER_SITE_OTHER): Payer: BC Managed Care – PPO | Admitting: *Deleted

## 2020-05-14 DIAGNOSIS — J309 Allergic rhinitis, unspecified: Secondary | ICD-10-CM

## 2020-06-11 ENCOUNTER — Ambulatory Visit (INDEPENDENT_AMBULATORY_CARE_PROVIDER_SITE_OTHER): Payer: BC Managed Care – PPO

## 2020-06-11 DIAGNOSIS — J309 Allergic rhinitis, unspecified: Secondary | ICD-10-CM | POA: Diagnosis not present

## 2020-06-20 DIAGNOSIS — J301 Allergic rhinitis due to pollen: Secondary | ICD-10-CM

## 2020-06-20 NOTE — Progress Notes (Signed)
Vials exp 06-20-21 

## 2020-07-09 ENCOUNTER — Ambulatory Visit (INDEPENDENT_AMBULATORY_CARE_PROVIDER_SITE_OTHER): Payer: BC Managed Care – PPO | Admitting: *Deleted

## 2020-07-09 DIAGNOSIS — J309 Allergic rhinitis, unspecified: Secondary | ICD-10-CM

## 2020-08-06 ENCOUNTER — Ambulatory Visit (INDEPENDENT_AMBULATORY_CARE_PROVIDER_SITE_OTHER): Payer: BC Managed Care – PPO

## 2020-08-06 DIAGNOSIS — J309 Allergic rhinitis, unspecified: Secondary | ICD-10-CM

## 2020-08-12 ENCOUNTER — Ambulatory Visit (INDEPENDENT_AMBULATORY_CARE_PROVIDER_SITE_OTHER): Payer: BC Managed Care – PPO | Admitting: *Deleted

## 2020-08-12 DIAGNOSIS — J309 Allergic rhinitis, unspecified: Secondary | ICD-10-CM | POA: Diagnosis not present

## 2020-08-21 ENCOUNTER — Ambulatory Visit (INDEPENDENT_AMBULATORY_CARE_PROVIDER_SITE_OTHER): Payer: BC Managed Care – PPO

## 2020-08-21 DIAGNOSIS — J309 Allergic rhinitis, unspecified: Secondary | ICD-10-CM

## 2020-08-27 ENCOUNTER — Ambulatory Visit (INDEPENDENT_AMBULATORY_CARE_PROVIDER_SITE_OTHER): Payer: BC Managed Care – PPO | Admitting: *Deleted

## 2020-08-27 DIAGNOSIS — J309 Allergic rhinitis, unspecified: Secondary | ICD-10-CM

## 2020-09-03 ENCOUNTER — Ambulatory Visit (INDEPENDENT_AMBULATORY_CARE_PROVIDER_SITE_OTHER): Payer: BC Managed Care – PPO

## 2020-09-03 DIAGNOSIS — J309 Allergic rhinitis, unspecified: Secondary | ICD-10-CM

## 2020-10-01 ENCOUNTER — Ambulatory Visit (INDEPENDENT_AMBULATORY_CARE_PROVIDER_SITE_OTHER): Payer: BC Managed Care – PPO

## 2020-10-01 DIAGNOSIS — J309 Allergic rhinitis, unspecified: Secondary | ICD-10-CM

## 2020-10-29 ENCOUNTER — Ambulatory Visit (INDEPENDENT_AMBULATORY_CARE_PROVIDER_SITE_OTHER): Payer: BC Managed Care – PPO

## 2020-10-29 DIAGNOSIS — J309 Allergic rhinitis, unspecified: Secondary | ICD-10-CM | POA: Diagnosis not present

## 2020-11-26 ENCOUNTER — Ambulatory Visit (INDEPENDENT_AMBULATORY_CARE_PROVIDER_SITE_OTHER): Payer: BC Managed Care – PPO | Admitting: *Deleted

## 2020-11-26 DIAGNOSIS — J309 Allergic rhinitis, unspecified: Secondary | ICD-10-CM

## 2020-12-03 ENCOUNTER — Other Ambulatory Visit: Payer: Self-pay

## 2020-12-03 ENCOUNTER — Ambulatory Visit: Payer: BC Managed Care – PPO | Admitting: Allergy and Immunology

## 2020-12-03 VITALS — BP 118/82 | HR 78 | Temp 98.0°F | Resp 14 | Ht 69.0 in | Wt 209.8 lb

## 2020-12-03 DIAGNOSIS — L508 Other urticaria: Secondary | ICD-10-CM | POA: Diagnosis not present

## 2020-12-03 DIAGNOSIS — J3089 Other allergic rhinitis: Secondary | ICD-10-CM | POA: Diagnosis not present

## 2020-12-03 MED ORDER — MONTELUKAST SODIUM 10 MG PO TABS: 10.0000 mg | ORAL_TABLET | Freq: Every day | ORAL | 5 refills | Status: DC

## 2020-12-03 MED ORDER — EPINEPHRINE 0.3 MG/0.3ML IJ SOAJ: 0.3000 mg | INTRAMUSCULAR | 2 refills | Status: DC | PRN

## 2020-12-03 NOTE — Patient Instructions (Signed)
  1. Continue Zyrtec up to 40 mg daily  2. Continue montelukast 10 mg daily  3. Continue immunotherapy and EpiPen  4.  Return to clinic in 1 year or earlier if problem

## 2020-12-03 NOTE — Progress Notes (Signed)
[place] - [place] - [place] - [place] - [place]   Follow-up Note  Referring Provider: Tresa Garter, MD Primary Provider: Tresa Garter, MD Date of Office Visit: 12/03/2020  Subjective:   Jose Mahoney (DOB: [birth year]) is a 55 y.o. male who [facility] in re-evaluation of the following:  HPI: Jose Mahoney returns to this clinic in evaluation of allergic rhinitis treated with immunotherapy and a history of urticaria.  His last visit to this clinic was 28 November 2019.  Overall he has really done well since his last visit and has not required a systemic steroid or an antibiotic for any type of airway issue.  He has had very little problems as he goes through each season of the year while he continues on immunotherapy currently at every 4 weeks.  He intermittently uses montelukast at this point in time which is relatively rare and he continues to use Zyrtec.  He has not had any urticaria.  He has received 3 Moderna Covid vaccines.  Allergies as of 12/03/2020      Reactions   Flonase [fluticasone Propionate] Other (See Comments)   Headaches      Medication List      anastrozole 1 MG tablet Commonly known as: ARIMIDEX 1/2 MG a week   cetirizine 10 MG tablet Commonly known as: ZYRTEC Take 10 mg by mouth 2 (two) times daily.   diphenhydrAMINE 25 mg capsule Commonly known as: BENADRYL Take 25 mg by mouth at bedtime as needed.   EPINEPHrine 0.3 mg/0.3 mL Soaj injection Commonly known as: EPI-PEN Inject 0.3 mLs (0.3 mg total) into the muscle as needed for anaphylaxis. Inject into thigh if needed for severe allergic reaction   montelukast 10 MG tablet Commonly known as: SINGULAIR Take 1 tablet (10 mg total) by mouth at bedtime.   NONFORMULARY OR COMPOUNDED ITEM Allergy Vaccine AB 1:10/ C 1:50 Given at [facility]   pseudoephedrine 30 MG tablet Commonly known as: SUDAFED Take 30-60 mg by mouth 3 (three)  times daily as needed. For congestion   temazepam 15 MG capsule Commonly known as: RESTORIL 1-2 caps for sleep as needed   testosterone cypionate 200 MG/ML injection Commonly known as: DEPOTESTOSTERONE CYPIONATE SMARTSIG:0.4 Milliliter(s) IM Once a Week       Past Medical History:  Diagnosis Date  . Allergic rhinitis   . Asthma   . Chronic insomnia   . Male hypogonadism   . Neck pain   . Recurrent upper respiratory infection (URI)   . Urticaria     Past Surgical History:  Procedure Laterality Date  . CYSTECTOMY    . ORCHIECTOMY    . TESTICLE TORSION REDUCTION     Left     Review of systems negative except as noted in HPI / PMHx or noted below:  Review of Systems  Constitutional: Negative.   HENT: Negative.   Eyes: Negative.   Respiratory: Negative.   Cardiovascular: Negative.   Gastrointestinal: Negative.   Genitourinary: Negative.   Musculoskeletal: Negative.   Skin: Negative.   Neurological: Negative.   Endo/Heme/Allergies: Negative.   Psychiatric/Behavioral: Negative.      Objective:   Vitals:   12/03/20 0906  BP: 118/82  Pulse: 78  Resp: 14  Temp: 98 F (36.7 C)  SpO2: 96%   Height: 5\' 9"  (175.3 cm)  Weight: 209 lb 12.8 oz (95.2 kg)   Physical Exam Constitutional:      Appearance: He is not diaphoretic.  HENT:     Head: Normocephalic.     Right Ear: Tympanic membrane, ear canal and external ear normal.     Left Ear: Tympanic membrane, ear canal and external ear normal.     Nose: Nose normal. No mucosal edema or rhinorrhea.     Mouth/Throat:     Pharynx: Uvula midline. No oropharyngeal exudate.  Eyes:     Conjunctiva/sclera: Conjunctivae normal.  Neck:     Thyroid: No thyromegaly.     Trachea: Trachea normal. No tracheal tenderness or tracheal deviation.  Cardiovascular:     Rate and Rhythm: Normal rate and regular rhythm.     Heart sounds: Normal heart sounds, S1 normal and S2 normal. No murmur heard.   Pulmonary:     Effort: No  respiratory distress.     Breath sounds: Normal breath sounds. No stridor. No wheezing or rales.  Lymphadenopathy:     Head:     Right side of head: No tonsillar adenopathy.     Left side of head: No tonsillar adenopathy.     Cervical: No cervical adenopathy.  Skin:    Findings: No erythema or rash.     Nails: There is no clubbing.  Neurological:     Mental Status: He is alert.     Diagnostics: none  Assessment and Plan:   1. Other allergic rhinitis   2. Chronic urticaria     1. Continue Zyrtec up to 40 mg daily  2. Continue montelukast 10 mg daily  3. Continue immunotherapy and EpiPen  4.  Return to clinic in 1 year or earlier if problem   Jose Mahoney has really done well on his current plan and he will continue on immunotherapy currently at every 4 weeks.  He can utilize montelukast and Zyrtec as needed.  We will see him back in his clinic in 1 year or earlier if there is a problem.  Laurette Schimke, MD Allergy / Immunology [facility]

## 2020-12-04 ENCOUNTER — Encounter: Payer: Self-pay | Admitting: Allergy and Immunology

## 2020-12-19 NOTE — Progress Notes (Signed)
Patient ID: Jose Mahoney, male    DOB: 1966-05-29, 55 y.o.   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 Allergy vacine here in past, now managed by Dr Lucie Leather  -------------------------------------------------------------------------------   12/21/19- 53 yoM former smoker followed for Insomnia, complicated by  Allergic Rhinitis (Dr Lucie Leather), Asthma,  Urticaria,  Temazepam 15,  ------f/u Chronic  Insomnia. Breathing is at baseline.  2 Covax Usually needs 2 temazepam (= 30 mg). Occasional melatonin discussed. Overall quality of life is well maintained with no concerns. Medication talk done.  He denies interval medical concerns.   12/20/20- 40 yoM former smoker followed for Insomnia, complicated by  Allergic Rhinitis (Dr Lucie Leather), Asthma,  Urticaria,  -Temazepam 15, Benadryl  Covid vax-2 Moderna Flu vax- -----55yr f/u for insomnia. States he has not been sleeping well but thinks this is due to a change in his diet . Still only using the temazepam once or twice a week. Has trouble staying asleep. Will need a refill today.  Now on Keto diet. Comfortable with the way temazepam works for him, using 15-30 mg occasional nights. Aware of other med classes and we discussed availability of CBT on-line. Deneies other health changes or concerns.  ROS-see HPI    + = positive Constitutional:     weight loss, no- night sweats, fevers, chills, fatigue, lassitude. HEENT:   No-  headaches, difficulty swallowing, tooth/dental problems,  sore throat,       No-  sneezing, itching, ear ache,  congestion, post nasal drip,  CV:  No-   chest pain, orthopnea, PND, swelling in lower extremities, anasarca, dizziness, palpitations Resp: No-   shortness of breath with exertion or at rest.              No-   productive cough,   non-productive cough,  No- coughing up of blood.      No-   change in  color of mucus.  No-occasional wheezing.   Skin: HPI GI:  No-   heartburn, indigestion, abdominal pain, nausea, vomiting,  GU:  MS:  No-   joint pain or swelling.   Neuro-     nothing unusual Psych:  No- change in mood or affect. No depression or anxiety.  No memory loss.  Objective:   Physical Exam General- Alert, Oriented, Affect-appropriate, Distress- none acute,  + overweight  Skin- ,excoriation- none, No rash demonstrated at this visit.  Lymphadenopathy- none Head- atraumatic            Eyes- Gross vision intact, PERRLA, conjunctivae clear secretions            Ears- Hearing, canals            Nose-  No-Septal dev, mucus, polyps, erosion, perforation. nasal  Throat- Mallampati III , mucosa-Clear, drainage -none, tonsils- atrophic Neck- flexible , trachea midline, no stridor , thyroid nl, carotid no bruit Chest - symmetrical excursion , unlabored           Heart/CV- RRR , no murmur , no gallop  , no rub, nl s1 s2                           - JVD- none , edema- none, stasis changes- none, varices- none           Lung- clear to P&A, wheeze- none, cough-None , dullness-none, rub- none  Chest wall-  Abd-  Br/ Gen/ Rectal- Not done, not indicated Extrem- cyanosis- none, clubbing, none, atrophy- none, strength- nl Neuro- grossly intact to observation

## 2020-12-20 ENCOUNTER — Ambulatory Visit: Payer: BC Managed Care – PPO | Admitting: Internal Medicine

## 2020-12-20 ENCOUNTER — Other Ambulatory Visit: Payer: Self-pay

## 2020-12-20 ENCOUNTER — Encounter: Payer: Self-pay | Admitting: Internal Medicine

## 2020-12-20 DIAGNOSIS — G4700 Insomnia, unspecified: Secondary | ICD-10-CM | POA: Diagnosis not present

## 2020-12-20 DIAGNOSIS — J45998 Other asthma: Secondary | ICD-10-CM | POA: Diagnosis not present

## 2020-12-20 MED ORDER — TEMAZEPAM 15 MG PO CAPS: ORAL_CAPSULE | ORAL | 3 refills | Status: DC

## 2020-12-20 NOTE — Assessment & Plan Note (Signed)
Not indicating any concern now in Spring pollen season.

## 2020-12-20 NOTE — Assessment & Plan Note (Signed)
He does well with occasional temazepam. Satisfied to continue for now. Plan- temazepam refilled

## 2020-12-20 NOTE — Patient Instructions (Signed)
Temazepam discussed and refill sent  Please call if we can help

## 2020-12-24 ENCOUNTER — Ambulatory Visit (INDEPENDENT_AMBULATORY_CARE_PROVIDER_SITE_OTHER): Payer: BC Managed Care – PPO | Admitting: *Deleted

## 2020-12-24 DIAGNOSIS — J309 Allergic rhinitis, unspecified: Secondary | ICD-10-CM | POA: Diagnosis not present

## 2020-12-31 DIAGNOSIS — J301 Allergic rhinitis due to pollen: Secondary | ICD-10-CM | POA: Diagnosis not present

## 2020-12-31 NOTE — Progress Notes (Signed)
VIALS EXP 12-31-21 

## 2021-01-21 ENCOUNTER — Ambulatory Visit (INDEPENDENT_AMBULATORY_CARE_PROVIDER_SITE_OTHER): Payer: BC Managed Care – PPO | Admitting: *Deleted

## 2021-01-21 DIAGNOSIS — J309 Allergic rhinitis, unspecified: Secondary | ICD-10-CM | POA: Diagnosis not present

## 2021-02-18 ENCOUNTER — Ambulatory Visit (INDEPENDENT_AMBULATORY_CARE_PROVIDER_SITE_OTHER): Payer: BC Managed Care – PPO | Admitting: *Deleted

## 2021-02-18 DIAGNOSIS — J309 Allergic rhinitis, unspecified: Secondary | ICD-10-CM

## 2021-03-18 ENCOUNTER — Ambulatory Visit (INDEPENDENT_AMBULATORY_CARE_PROVIDER_SITE_OTHER): Payer: BC Managed Care – PPO

## 2021-03-18 DIAGNOSIS — J309 Allergic rhinitis, unspecified: Secondary | ICD-10-CM

## 2021-03-25 ENCOUNTER — Ambulatory Visit (INDEPENDENT_AMBULATORY_CARE_PROVIDER_SITE_OTHER): Payer: BC Managed Care – PPO | Admitting: *Deleted

## 2021-03-25 DIAGNOSIS — J309 Allergic rhinitis, unspecified: Secondary | ICD-10-CM

## 2021-04-01 ENCOUNTER — Ambulatory Visit (INDEPENDENT_AMBULATORY_CARE_PROVIDER_SITE_OTHER): Payer: BC Managed Care – PPO

## 2021-04-01 DIAGNOSIS — J309 Allergic rhinitis, unspecified: Secondary | ICD-10-CM

## 2021-04-08 ENCOUNTER — Ambulatory Visit (INDEPENDENT_AMBULATORY_CARE_PROVIDER_SITE_OTHER): Payer: BC Managed Care – PPO | Admitting: *Deleted

## 2021-04-08 DIAGNOSIS — J309 Allergic rhinitis, unspecified: Secondary | ICD-10-CM | POA: Diagnosis not present

## 2021-04-15 ENCOUNTER — Ambulatory Visit (INDEPENDENT_AMBULATORY_CARE_PROVIDER_SITE_OTHER): Payer: BC Managed Care – PPO | Admitting: *Deleted

## 2021-04-15 DIAGNOSIS — J309 Allergic rhinitis, unspecified: Secondary | ICD-10-CM | POA: Diagnosis not present

## 2021-05-13 ENCOUNTER — Ambulatory Visit (INDEPENDENT_AMBULATORY_CARE_PROVIDER_SITE_OTHER): Payer: BC Managed Care – PPO | Admitting: *Deleted

## 2021-05-13 DIAGNOSIS — J309 Allergic rhinitis, unspecified: Secondary | ICD-10-CM | POA: Diagnosis not present

## 2021-06-10 ENCOUNTER — Ambulatory Visit (INDEPENDENT_AMBULATORY_CARE_PROVIDER_SITE_OTHER): Payer: BC Managed Care – PPO

## 2021-06-10 DIAGNOSIS — J309 Allergic rhinitis, unspecified: Secondary | ICD-10-CM | POA: Diagnosis not present

## 2021-07-08 ENCOUNTER — Ambulatory Visit (INDEPENDENT_AMBULATORY_CARE_PROVIDER_SITE_OTHER): Payer: BC Managed Care – PPO | Admitting: *Deleted

## 2021-07-08 DIAGNOSIS — J309 Allergic rhinitis, unspecified: Secondary | ICD-10-CM

## 2021-08-05 ENCOUNTER — Ambulatory Visit (INDEPENDENT_AMBULATORY_CARE_PROVIDER_SITE_OTHER): Payer: BC Managed Care – PPO

## 2021-08-05 DIAGNOSIS — J309 Allergic rhinitis, unspecified: Secondary | ICD-10-CM | POA: Diagnosis not present

## 2021-08-26 NOTE — Progress Notes (Signed)
VIALS MADE. EXP 08-26-22 °

## 2021-08-27 DIAGNOSIS — J301 Allergic rhinitis due to pollen: Secondary | ICD-10-CM

## 2021-09-02 ENCOUNTER — Ambulatory Visit (INDEPENDENT_AMBULATORY_CARE_PROVIDER_SITE_OTHER): Payer: BC Managed Care – PPO | Admitting: *Deleted

## 2021-09-02 DIAGNOSIS — J309 Allergic rhinitis, unspecified: Secondary | ICD-10-CM | POA: Diagnosis not present

## 2021-09-30 ENCOUNTER — Ambulatory Visit (INDEPENDENT_AMBULATORY_CARE_PROVIDER_SITE_OTHER): Payer: BC Managed Care – PPO | Admitting: *Deleted

## 2021-09-30 DIAGNOSIS — J309 Allergic rhinitis, unspecified: Secondary | ICD-10-CM | POA: Diagnosis not present

## 2021-10-28 ENCOUNTER — Ambulatory Visit (INDEPENDENT_AMBULATORY_CARE_PROVIDER_SITE_OTHER): Payer: BC Managed Care – PPO

## 2021-10-28 DIAGNOSIS — J309 Allergic rhinitis, unspecified: Secondary | ICD-10-CM | POA: Diagnosis not present

## 2021-11-04 ENCOUNTER — Ambulatory Visit (INDEPENDENT_AMBULATORY_CARE_PROVIDER_SITE_OTHER): Payer: BC Managed Care – PPO | Admitting: *Deleted

## 2021-11-04 DIAGNOSIS — J309 Allergic rhinitis, unspecified: Secondary | ICD-10-CM

## 2021-11-11 ENCOUNTER — Ambulatory Visit (INDEPENDENT_AMBULATORY_CARE_PROVIDER_SITE_OTHER): Payer: BC Managed Care – PPO

## 2021-11-11 DIAGNOSIS — J309 Allergic rhinitis, unspecified: Secondary | ICD-10-CM

## 2021-11-18 ENCOUNTER — Ambulatory Visit (INDEPENDENT_AMBULATORY_CARE_PROVIDER_SITE_OTHER): Payer: BC Managed Care – PPO

## 2021-11-18 DIAGNOSIS — J309 Allergic rhinitis, unspecified: Secondary | ICD-10-CM

## 2021-11-25 ENCOUNTER — Ambulatory Visit (INDEPENDENT_AMBULATORY_CARE_PROVIDER_SITE_OTHER): Payer: BC Managed Care – PPO

## 2021-11-25 DIAGNOSIS — J309 Allergic rhinitis, unspecified: Secondary | ICD-10-CM | POA: Diagnosis not present

## 2021-12-09 ENCOUNTER — Ambulatory Visit: Payer: BC Managed Care – PPO | Admitting: Allergy and Immunology

## 2021-12-09 DIAGNOSIS — J3089 Other allergic rhinitis: Secondary | ICD-10-CM

## 2021-12-09 DIAGNOSIS — L508 Other urticaria: Secondary | ICD-10-CM | POA: Diagnosis not present

## 2021-12-09 NOTE — Patient Instructions (Addendum)
?  1. Continue Zyrtec up to 40 mg daily ? ?2. Continue montelukast 10 mg 1 time per day if needed ? ?3. Continue immunotherapy and EpiPen ? ?4.  Return to clinic in 1 year or earlier if problem ?  ? ?  ? ?

## 2021-12-09 NOTE — Progress Notes (Signed)
? ?[place] - [place] - [place] - [place] - [place] ? ? ?Follow-up Note ? ?Referring Provider: Plotnikov, Georgina Quint, MD ?Primary Provider: Tresa Garter, MD ?Date of Office Visit: 12/09/2021 ? ?Subjective:  ? ?Jose Mahoney (DOB: December 23, 1965) is a 56 y.o. male who [facility] 12[facility]/2021 in re-evaluation of the following: ? ?HPI: Jose Mahoney returns to this clinic in evaluation of allergic rhinitis and history of urticaria.  I last saw him in this clinic on 03 December 2020. ? ?He is currently using immunotherapy at every 4 weeks without any adverse effect and this form of treatment has really resulted in very good control of his immune activation with very little nasal symptoms and no bouts of urticaria as he goes through each season of the year while he also uses Singulair intermittently and Zyrtec most days. ? ?Allergies as of 12/09/2021   ? ?   Reactions  ? Flonase [fluticasone Propionate] Other (See Comments)  ? Headaches  ? ?  ? ?  ?Medication List  ? ? ?anastrozole 1 MG tablet ?Commonly known as: ARIMIDEX ?1/2 MG a week ?  ?cetirizine 10 MG tablet ?Commonly known as: ZYRTEC ?Take 10 mg by mouth 2 (two) times daily. ?  ?diphenhydrAMINE 25 mg capsule ?Commonly known as: BENADRYL ?Take 25 mg by mouth at bedtime as needed. ?  ?EPINEPHrine 0.3 mg/0.3 mL Soaj injection ?Commonly known as: EPI-PEN ?Inject 0.3 mg into the muscle as needed for anaphylaxis. Inject into thigh if needed for severe allergic reaction ?  ?montelukast 10 MG tablet ?Commonly known as: SINGULAIR ?Take 1 tablet (10 mg total) by mouth at bedtime. ?  ?NONFORMULARY OR COMPOUNDED ITEM ?Allergy Vaccine ?AB 1:10/ C 1:50 ?Given at [facility] ?  ?pseudoephedrine 30 MG tablet ?Commonly known as: SUDAFED ?Take 30-60 mg by mouth 3 (three) times daily as needed. For congestion ?  ?temazepam 15 MG capsule ?Commonly known as: RESTORIL ?1-2 caps for sleep as needed ?  ?testosterone cypionate 200 MG/ML  injection ?Commonly known as: DEPOTESTOSTERONE CYPIONATE ?SMARTSIG:0.4 Milliliter(s) IM Once a Week ?  ? ?Past Medical History:  ?Diagnosis Date  ? Allergic rhinitis   ? Asthma   ? Chronic insomnia   ? Male hypogonadism   ? Neck pain   ? Recurrent upper respiratory infection (URI)   ? Urticaria   ? ? ?Past Surgical History:  ?Procedure Laterality Date  ? CYSTECTOMY    ? ORCHIECTOMY    ? TESTICLE TORSION REDUCTION    ? Left   ? ? ?Review of systems negative except as noted in HPI / PMHx or noted below: ? ?Review of Systems  ?Constitutional: Negative.   ?HENT: Negative.    ?Eyes: Negative.   ?Respiratory: Negative.    ?Cardiovascular: Negative.   ?Gastrointestinal: Negative.   ?Genitourinary: Negative.   ?Musculoskeletal: Negative.   ?Skin: Negative.   ?Neurological: Negative.   ?Endo/Heme/Allergies: Negative.   ?Psychiatric/Behavioral: Negative.    ? ? ?Objective:  ?  ? ?Physical Exam ?Constitutional:   ?   Appearance: He is not diaphoretic.  ?HENT:  ?   Head: Normocephalic.  ?   Right Ear: Tympanic membrane, ear canal and external ear normal.  ?   Left Ear: Tympanic membrane, ear canal and external ear normal.  ?   Nose: Nose normal. No mucosal edema or rhinorrhea.  ?   Mouth/Throat:  ?   Pharynx: Uvula midline. No oropharyngeal exudate.  ?Eyes:  ?   Conjunctiva/sclera: Conjunctivae normal.  ?  Neck:  ?   Thyroid: No thyromegaly.  ?   Trachea: Trachea normal. No tracheal tenderness or tracheal deviation.  ?Cardiovascular:  ?   Rate and Rhythm: Normal rate and regular rhythm.  ?   Heart sounds: Normal heart sounds, S1 normal and S2 normal. No murmur heard. ?Pulmonary:  ?   Effort: No respiratory distress.  ?   Breath sounds: Normal breath sounds. No stridor. No wheezing or rales.  ?Lymphadenopathy:  ?   Head:  ?   Right side of head: No tonsillar adenopathy.  ?   Left side of head: No tonsillar adenopathy.  ?   Cervical: No cervical adenopathy.  ?Skin: ?   Findings: No erythema or rash.  ?   Nails: There is no  clubbing.  ?Neurological:  ?   Mental Status: He is alert.  ? ? ?Diagnostics: none ? ?Assessment and Plan:  ? ?1. Other allergic rhinitis   ?2. Chronic urticaria   ? ? ?1. Continue Zyrtec up to 40 mg daily ? ?2. Continue montelukast 10 mg 1 time per day if needed ? ?3. Continue immunotherapy and EpiPen ? ?4.  Return to clinic in 1 year or earlier if problem ?  ?Tim appears to be doing very well while utilizing immunotherapy and he will continue on this form of treatment as well as utilizing cetirizine and montelukast should they be required.  I will see him back in this clinic in 1 year or earlier if there is a problem. ?  ?Laurette Schimke, MD ?Allergy / Immunology ?[facility] ?

## 2021-12-10 ENCOUNTER — Encounter: Payer: Self-pay | Admitting: Allergy and Immunology

## 2021-12-10 MED ORDER — EPINEPHRINE 0.3 MG/0.3ML IJ SOAJ: 0.3000 mg | INTRAMUSCULAR | 2 refills | Status: DC | PRN

## 2021-12-10 MED ORDER — MONTELUKAST SODIUM 10 MG PO TABS: 10.0000 mg | ORAL_TABLET | Freq: Every day | ORAL | 3 refills | Status: DC

## 2021-12-19 NOTE — Progress Notes (Signed)
? Patient ID: Jose Mahoney, male    DOB: April 26, 1966, 56 y.o.   MRN: [id number] ? 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 ?Allergy vacine here in past, now managed by Dr Lucie Leather ? ?------------------------------------------------------------------------------- ? ? ?12/20/20- 54 yoM former smoker followed for Insomnia, complicated by  Allergic Rhinitis (Dr Lucie Leather), Asthma,  Urticaria,  ?-Temazepam 15, Benadryl  ?Covid vax-2 Moderna ?Flu vax- ?-----36yr f/u for insomnia. States he has not been sleeping well but thinks this is due to a change in his diet . Still only using the temazepam once or twice a week. Has trouble staying asleep. Will need a refill today.  ?Now on Keto diet. Comfortable with the way temazepam works for him, using 15-30 mg occasional nights. Aware of other med classes and we discussed availability of CBT on-line. Denies other health changes or concerns. ? ?12/22/21- 55 yoM former smoker followed for Insomnia, complicated by  Allergic Rhinitis (Dr Lucie Leather), Asthma,  Urticaria,  ?-Temazepam 15, Benadryl  ?Covid vax-2 Moderna ?Flu vax-no ?Body weight today 183 lbs ?ACT score 25 ?Sleep pattern is stable and satisfactory for him.  He does not expect to get 7-8 hours, but he feels well.  He will use temazepam 15 mg once or twice per week if the previous night was restless.  Sometimes uses Benadryl in peak allergy season, but as an antihistamine only.  Asthma and allergic rhinitis are stable, managed by his allergist.  He denies new problems. ? ?ROS-see HPI    + = positive ?Constitutional:     weight loss, no- night sweats, fevers, chills, fatigue, lassitude. ?HEENT:   No-  headaches, difficulty swallowing, tooth/dental problems,  sore throat,  ?     No-  sneezing, itching, ear ache,  congestion, post nasal drip,  ?CV:  No-   chest pain, orthopnea, PND, swelling in lower extremities, anasarca,  dizziness, palpitations ?Resp: No-   shortness of breath with exertion or at rest.   ?           No-   productive cough,   non-productive cough,  No- coughing up of blood.   ?   No-   change in color of mucus.  No-occasional wheezing.   ?Skin: HPI ?GI:  No-   heartburn, indigestion, abdominal pain, nausea, vomiting,  ?GU:  ?MS:  No-   joint pain or swelling.   ?Neuro-     nothing unusual ?Psych:  No- change in mood or affect. No depression or anxiety.  No memory loss. ? ?Objective:  ? Physical Exam ?General- Alert, Oriented, Affect-appropriate, Distress- none acute, +looks fit ?Skin- ,excoriation- none, No rash demonstrated at this visit.  ?Lymphadenopathy- none ?Head- atraumatic ?           Eyes- Gross vision intact, PERRLA, conjunctivae clear secretions ?           Ears- Hearing, canals ?           Nose-  No-Septal dev, mucus, polyps, erosion, perforation. nasal  ?Throat- Mallampati III , mucosa-Clear, drainage -none, tonsils- atrophic ?Neck- flexible , trachea midline, no stridor , thyroid nl, carotid no bruit ?Chest - symmetrical excursion , unlabored ?          Heart/CV- RRR , no murmur , no gallop  , no rub, nl s1 s2 ?                          -  JVD- none , edema- none, stasis changes- none, varices- none ?          Lung- clear to P&A, wheeze- none, cough-None , dullness-none, rub- none ?          Chest wall-  ?Abd-  ?Br/ Gen/ Rectal- Not done, not indicated ?Extrem- cyanosis- none, clubbing, none, atrophy- none, strength- nl ?Neuro- grossly intact to observation ? ?   ? ? ? ? ?

## 2021-12-22 ENCOUNTER — Ambulatory Visit (INDEPENDENT_AMBULATORY_CARE_PROVIDER_SITE_OTHER): Payer: BC Managed Care – PPO

## 2021-12-22 ENCOUNTER — Encounter: Payer: Self-pay | Admitting: Internal Medicine

## 2021-12-22 ENCOUNTER — Ambulatory Visit: Payer: BC Managed Care – PPO | Admitting: Internal Medicine

## 2021-12-22 DIAGNOSIS — J45998 Other asthma: Secondary | ICD-10-CM | POA: Diagnosis not present

## 2021-12-22 DIAGNOSIS — G4700 Insomnia, unspecified: Secondary | ICD-10-CM

## 2021-12-22 DIAGNOSIS — J309 Allergic rhinitis, unspecified: Secondary | ICD-10-CM

## 2021-12-22 MED ORDER — TEMAZEPAM 15 MG PO CAPS: ORAL_CAPSULE | ORAL | 3 refills | Status: DC

## 2021-12-22 NOTE — Patient Instructions (Signed)
Temazepam refilled as we have been doing.  ? ?Please let us know if we can help or do things differently. ?

## 2021-12-22 NOTE — Assessment & Plan Note (Signed)
Managed by his Allergist with no concerns. ?

## 2021-12-22 NOTE — Assessment & Plan Note (Signed)
Comfortable with current management. ?Plan-refilled temazepam.  Discussed sleep habits. ?

## 2022-01-20 ENCOUNTER — Ambulatory Visit (INDEPENDENT_AMBULATORY_CARE_PROVIDER_SITE_OTHER): Payer: BC Managed Care – PPO

## 2022-01-20 DIAGNOSIS — J309 Allergic rhinitis, unspecified: Secondary | ICD-10-CM | POA: Diagnosis not present

## 2022-02-17 ENCOUNTER — Ambulatory Visit (INDEPENDENT_AMBULATORY_CARE_PROVIDER_SITE_OTHER): Payer: BC Managed Care – PPO

## 2022-02-17 DIAGNOSIS — J309 Allergic rhinitis, unspecified: Secondary | ICD-10-CM

## 2022-03-16 ENCOUNTER — Ambulatory Visit (INDEPENDENT_AMBULATORY_CARE_PROVIDER_SITE_OTHER): Payer: BC Managed Care – PPO

## 2022-03-16 DIAGNOSIS — J309 Allergic rhinitis, unspecified: Secondary | ICD-10-CM

## 2022-03-18 DIAGNOSIS — J3089 Other allergic rhinitis: Secondary | ICD-10-CM

## 2022-03-18 NOTE — Progress Notes (Signed)
VIALS EXP 03-19-23 

## 2022-04-14 ENCOUNTER — Ambulatory Visit (INDEPENDENT_AMBULATORY_CARE_PROVIDER_SITE_OTHER): Payer: BC Managed Care – PPO | Admitting: *Deleted

## 2022-04-14 DIAGNOSIS — J309 Allergic rhinitis, unspecified: Secondary | ICD-10-CM

## 2022-05-11 ENCOUNTER — Ambulatory Visit (INDEPENDENT_AMBULATORY_CARE_PROVIDER_SITE_OTHER): Payer: BC Managed Care – PPO

## 2022-05-11 DIAGNOSIS — J309 Allergic rhinitis, unspecified: Secondary | ICD-10-CM

## 2022-06-09 ENCOUNTER — Ambulatory Visit (INDEPENDENT_AMBULATORY_CARE_PROVIDER_SITE_OTHER): Payer: BC Managed Care – PPO | Admitting: *Deleted

## 2022-06-09 DIAGNOSIS — J309 Allergic rhinitis, unspecified: Secondary | ICD-10-CM | POA: Diagnosis not present

## 2022-06-16 ENCOUNTER — Ambulatory Visit (INDEPENDENT_AMBULATORY_CARE_PROVIDER_SITE_OTHER): Payer: BC Managed Care – PPO

## 2022-06-16 DIAGNOSIS — J309 Allergic rhinitis, unspecified: Secondary | ICD-10-CM | POA: Diagnosis not present

## 2022-06-23 ENCOUNTER — Ambulatory Visit (INDEPENDENT_AMBULATORY_CARE_PROVIDER_SITE_OTHER): Payer: BC Managed Care – PPO | Admitting: *Deleted

## 2022-06-23 DIAGNOSIS — J309 Allergic rhinitis, unspecified: Secondary | ICD-10-CM | POA: Diagnosis not present

## 2022-06-30 ENCOUNTER — Ambulatory Visit (INDEPENDENT_AMBULATORY_CARE_PROVIDER_SITE_OTHER): Payer: BC Managed Care – PPO | Admitting: *Deleted

## 2022-06-30 DIAGNOSIS — J309 Allergic rhinitis, unspecified: Secondary | ICD-10-CM | POA: Diagnosis not present

## 2022-07-07 ENCOUNTER — Ambulatory Visit (INDEPENDENT_AMBULATORY_CARE_PROVIDER_SITE_OTHER): Payer: BC Managed Care – PPO

## 2022-07-07 DIAGNOSIS — J309 Allergic rhinitis, unspecified: Secondary | ICD-10-CM

## 2022-08-04 ENCOUNTER — Ambulatory Visit (INDEPENDENT_AMBULATORY_CARE_PROVIDER_SITE_OTHER): Payer: BC Managed Care – PPO

## 2022-08-04 DIAGNOSIS — J309 Allergic rhinitis, unspecified: Secondary | ICD-10-CM | POA: Diagnosis not present

## 2022-09-02 ENCOUNTER — Ambulatory Visit (INDEPENDENT_AMBULATORY_CARE_PROVIDER_SITE_OTHER): Payer: BC Managed Care – PPO

## 2022-09-02 DIAGNOSIS — J309 Allergic rhinitis, unspecified: Secondary | ICD-10-CM

## 2022-09-29 ENCOUNTER — Ambulatory Visit (INDEPENDENT_AMBULATORY_CARE_PROVIDER_SITE_OTHER): Payer: BC Managed Care – PPO

## 2022-09-29 DIAGNOSIS — J309 Allergic rhinitis, unspecified: Secondary | ICD-10-CM

## 2022-10-27 ENCOUNTER — Ambulatory Visit (INDEPENDENT_AMBULATORY_CARE_PROVIDER_SITE_OTHER): Payer: BC Managed Care – PPO

## 2022-10-27 DIAGNOSIS — J309 Allergic rhinitis, unspecified: Secondary | ICD-10-CM | POA: Diagnosis not present

## 2022-11-24 ENCOUNTER — Ambulatory Visit (INDEPENDENT_AMBULATORY_CARE_PROVIDER_SITE_OTHER): Payer: BC Managed Care – PPO

## 2022-11-24 DIAGNOSIS — J309 Allergic rhinitis, unspecified: Secondary | ICD-10-CM | POA: Diagnosis not present

## 2022-12-01 NOTE — Progress Notes (Signed)
VIALS EXP 12-01-23

## 2022-12-02 DIAGNOSIS — J3089 Other allergic rhinitis: Secondary | ICD-10-CM

## 2022-12-22 NOTE — Progress Notes (Unsigned)
Patient ID: Jose Mahoney, male    DOB: [birth year], 57 y.o.   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 Allergy vacine here in past, now managed by Dr Lucie Leather  -------------------------------------------------------------------------------   12/22/21- 55 yoM former smoker followed for Insomnia, complicated by  Allergic Rhinitis (Dr Lucie Leather), Asthma,  Urticaria,  -Temazepam 15, Benadryl  Covid vax-2 Moderna Flu vax-no Body weight today 183 lbs ACT score 25 Sleep pattern is stable and satisfactory for him.  He does not expect to get 7-8 hours, but he feels well.  He will use temazepam 15 mg once or twice per week if the previous night was restless.  Sometimes uses Benadryl in peak allergy season, but as an antihistamine only.  Asthma and allergic rhinitis are stable, managed by his allergist.  He denies new problems.  12/24/22- - 56 yoM former smoker followed for Insomnia, complicated by  Allergic Rhinitis (Dr Lucie Leather), Asthma,  Urticaria,  -Temazepam 15, Benadryl  Covid vax-2 Moderna Flu vax-no Body weight today - 202 lbs He seems mainly now to associate difficulty sleeping with intervals of stress.  He only uses temazepam when needed, 1 Or sometimes 2.  We discussed options to change to something else or to increase to 30 mg temazepam but he is satisfied for now to state where he is.  Denies other changes or problems with his overall health.  Asthma and allergic rhinitis are still managed by his allergist.  Pt states he is doing well ROS-see HPI    + = positive Constitutional:     weight loss, no- night sweats, fevers, chills, fatigue, lassitude. HEENT:   No-  headaches, difficulty swallowing, tooth/dental problems,  sore throat,       No-  sneezing, itching, ear ache,  congestion, post nasal drip,  CV:  No-   chest pain, orthopnea, PND, swelling in lower  extremities, anasarca, dizziness, palpitations Resp: No-   shortness of breath with exertion or at rest.              No-   productive cough,   non-productive cough,  No- coughing up of blood.      No-   change in color of mucus.  No-occasional wheezing.   Skin: HPI GI:  No-   heartburn, indigestion, abdominal pain, nausea, vomiting,  GU:  MS:  No-   joint pain or swelling.   Neuro-     nothing unusual Psych:  No- change in mood or affect. No depression or anxiety.  No memory loss.  Objective:   Physical Exam General- Alert, Oriented, Affect-appropriate, Distress- none acute, +looks fit Skin- ,excoriation- none, No rash demonstrated at this visit.  Lymphadenopathy- none Head- atraumatic            Eyes- Gross vision intact, PERRLA, conjunctivae clear secretions            Ears- Hearing, canals            Nose-  No-Septal dev, mucus, polyps, erosion, perforation. nasal  Throat- Mallampati III , mucosa-Clear, drainage -none, tonsils- atrophic Neck- flexible , trachea midline, no stridor , thyroid nl, carotid no bruit Chest - symmetrical excursion , unlabored           Heart/CV- RRR , no murmur , no gallop  , no rub, nl s1 s2                           -  JVD- none , edema- none, stasis changes- none, varices- none           Lung- clear to P&A, wheeze- none, cough-None , dullness-none, rub- none           Chest wall-  Abd-  Br/ Gen/ Rectal- Not done, not indicated Extrem- cyanosis- none, clubbing, none, atrophy- none, strength- nl Neuro- grossly intact to observation

## 2022-12-24 ENCOUNTER — Encounter: Payer: Self-pay | Admitting: Internal Medicine

## 2022-12-24 ENCOUNTER — Ambulatory Visit (INDEPENDENT_AMBULATORY_CARE_PROVIDER_SITE_OTHER): Payer: BC Managed Care – PPO | Admitting: Internal Medicine

## 2022-12-24 ENCOUNTER — Ambulatory Visit (INDEPENDENT_AMBULATORY_CARE_PROVIDER_SITE_OTHER): Payer: BC Managed Care – PPO

## 2022-12-24 VITALS — BP 118/62 | HR 64 | Ht 69.0 in | Wt 202.0 lb

## 2022-12-24 DIAGNOSIS — G4700 Insomnia, unspecified: Secondary | ICD-10-CM

## 2022-12-24 DIAGNOSIS — J309 Allergic rhinitis, unspecified: Secondary | ICD-10-CM

## 2022-12-24 DIAGNOSIS — J3089 Other allergic rhinitis: Secondary | ICD-10-CM

## 2022-12-24 DIAGNOSIS — J302 Other seasonal allergic rhinitis: Secondary | ICD-10-CM | POA: Diagnosis not present

## 2022-12-24 MED ORDER — TEMAZEPAM 15 MG PO CAPS: ORAL_CAPSULE | ORAL | 3 refills | Status: DC

## 2022-12-24 NOTE — Assessment & Plan Note (Signed)
Continues allergy vaccine managed by his allergist.

## 2022-12-24 NOTE — Patient Instructions (Signed)
Temazepam refilled  Please call if we can help 

## 2022-12-24 NOTE — Assessment & Plan Note (Signed)
He is using temazepam appropriately as discussed.  We can try an alternative or change doses in the future if needed. Plan-refill temazepam

## 2022-12-29 ENCOUNTER — Ambulatory Visit (INDEPENDENT_AMBULATORY_CARE_PROVIDER_SITE_OTHER): Payer: BC Managed Care – PPO | Admitting: Allergy and Immunology

## 2022-12-29 VITALS — BP 98/68 | HR 78 | Temp 98.7°F | Resp 16 | Ht 69.0 in | Wt 202.3 lb

## 2022-12-29 DIAGNOSIS — J3089 Other allergic rhinitis: Secondary | ICD-10-CM | POA: Diagnosis not present

## 2022-12-29 DIAGNOSIS — L508 Other urticaria: Secondary | ICD-10-CM

## 2022-12-29 MED ORDER — EPINEPHRINE 0.3 MG/0.3ML IJ SOAJ: 0.3000 mg | INTRAMUSCULAR | 1 refills | Status: DC | PRN

## 2022-12-29 MED ORDER — CETIRIZINE HCL 10 MG PO TABS: 20.0000 mg | ORAL_TABLET | Freq: Two times a day (BID) | ORAL | 3 refills | Status: DC

## 2022-12-29 NOTE — Progress Notes (Unsigned)
[place] - [place] - [place] - [place] - [place]   Follow-up Note  Referring Provider: Tresa Garter, MD Primary Provider: Tresa Garter, MD Date of Office Visit: 12/29/2022  Subjective:   Jose Mahoney (DOB: [birth year]) is a 57 y.o. male who [facility] in re-evaluation of the following:  HPI: Jose Mahoney returns this clinic in evaluation of allergic rhinitis and history of urticaria.  I last saw him in this clinic 09 December 2021.  He continues on immunotherapy currently at every 4 weeks without any adverse effect and he continues on cetirizine at 20 mg a day and this combination has resulted in excellent control regarding his urticaria and his nasal issues.  He has no need to use any other medications and has discontinued his montelukast.  He informs me that he contracted COVID for the first time September 2023 treated successfully with Paxlovid without any long-term sequela.  He informs me that he has not had a colonoscopy ever.  Allergies as of 12/29/2022       Reactions   Flonase [fluticasone Propionate] Other (See Comments)   Headaches        Medication List    anastrozole 1 MG tablet Commonly known as: ARIMIDEX 1/2 MG every other  week   cetirizine 10 MG tablet Commonly known as: ZYRTEC Take 10 mg by mouth 2 (two) times daily.   diphenhydrAMINE 25 mg capsule Commonly known as: BENADRYL Take 25 mg by mouth at bedtime as needed.   EPINEPHrine 0.3 mg/0.3 mL Soaj injection Commonly known as: EPI-PEN Inject 0.3 mg into the muscle as needed for anaphylaxis. Inject into thigh if needed for severe allergic reaction   NONFORMULARY OR COMPOUNDED ITEM Allergy Vaccine AB 1:10/ C 1:50 Given at [facility]   temazepam 15 MG capsule Commonly known as: RESTORIL 1-2 caps for sleep as needed   testosterone cypionate 200 MG/ML injection Commonly known as: DEPOTESTOSTERONE CYPIONATE 0.6 weekly     Past Medical History:  Diagnosis Date   Allergic rhinitis    Asthma    Chronic insomnia    Male hypogonadism    Neck pain    Recurrent upper respiratory infection (URI)    Urticaria     Past Surgical History:  Procedure Laterality Date   CYSTECTOMY     ORCHIECTOMY     TESTICLE TORSION REDUCTION     Left     Review of systems negative except as noted in HPI / PMHx or noted below:  Review of Systems  Constitutional: Negative.   HENT: Negative.    Eyes: Negative.   Respiratory: Negative.    Cardiovascular: Negative.   Gastrointestinal: Negative.   Genitourinary: Negative.   Musculoskeletal: Negative.   Skin: Negative.   Neurological: Negative.   Endo/Heme/Allergies: Negative.   Psychiatric/Behavioral: Negative.       Objective:   Vitals:   12/29/22 1606  BP: 98/68  Pulse: 78  Resp: 16  Temp: 98.7 F (37.1 C)  SpO2: 96%   Height: 5\' 9"  (175.3 cm)  Weight: 202 lb 4.8 oz (91.8 kg)   Physical Exam Constitutional:      Appearance: He is not diaphoretic.  HENT:     Head: Normocephalic.     Right Ear: Tympanic membrane, ear canal and external ear normal.     Left Ear: Tympanic membrane, ear canal and external ear normal.     Nose: Nose normal. No mucosal edema or rhinorrhea.  Mouth/Throat:     Pharynx: Uvula midline. No oropharyngeal exudate.  Eyes:     Conjunctiva/sclera: Conjunctivae normal.  Neck:     Thyroid: No thyromegaly.     Trachea: Trachea normal. No tracheal tenderness or tracheal deviation.  Cardiovascular:     Rate and Rhythm: Normal rate and regular rhythm.     Heart sounds: Normal heart sounds, S1 normal and S2 normal. No murmur heard. Pulmonary:     Effort: No respiratory distress.     Breath sounds: Normal breath sounds. No stridor. No wheezing or rales.  Lymphadenopathy:     Head:     Right side of head: No tonsillar adenopathy.     Left side of head: No tonsillar adenopathy.     Cervical: No cervical adenopathy.  Skin:     Findings: No erythema or rash.     Nails: There is no clubbing.  Neurological:     Mental Status: He is alert.     Diagnostics: none  Assessment and Plan:   1. Other allergic rhinitis   2. Chronic urticaria    1. Continue Zyrtec up to 40 mg daily  2. Continue immunotherapy and EpiPen  3.  Return to clinic in 1 year or earlier if problem  4.  Contact GI clinic to arrange for a colonoscopy   Jose Mahoney is doing very well and he will continue on immunotherapy and we will see him back in this clinic in 1 year.  I did have a talk with him today about the fact that he has missed his 57 year old colonoscopy and he needs to get that arranged and completed in 2024.   Laurette Schimke, MD Allergy / Immunology [facility]

## 2022-12-29 NOTE — Patient Instructions (Signed)
  1. Continue Zyrtec up to 40 mg daily  2. Continue immunotherapy and EpiPen  3.  Return to clinic in 1 year or earlier if problem  4.  Contact GI clinic to arrange for a colonoscopy

## 2022-12-30 ENCOUNTER — Encounter: Payer: Self-pay | Admitting: Allergy and Immunology

## 2023-01-19 ENCOUNTER — Ambulatory Visit (INDEPENDENT_AMBULATORY_CARE_PROVIDER_SITE_OTHER): Payer: BC Managed Care – PPO

## 2023-01-19 DIAGNOSIS — J309 Allergic rhinitis, unspecified: Secondary | ICD-10-CM

## 2023-01-26 ENCOUNTER — Ambulatory Visit (INDEPENDENT_AMBULATORY_CARE_PROVIDER_SITE_OTHER): Payer: BC Managed Care – PPO | Admitting: *Deleted

## 2023-01-26 DIAGNOSIS — J309 Allergic rhinitis, unspecified: Secondary | ICD-10-CM | POA: Diagnosis not present

## 2023-02-02 ENCOUNTER — Ambulatory Visit (INDEPENDENT_AMBULATORY_CARE_PROVIDER_SITE_OTHER): Payer: BC Managed Care – PPO | Admitting: *Deleted

## 2023-02-02 DIAGNOSIS — J309 Allergic rhinitis, unspecified: Secondary | ICD-10-CM | POA: Diagnosis not present

## 2023-02-09 ENCOUNTER — Ambulatory Visit (INDEPENDENT_AMBULATORY_CARE_PROVIDER_SITE_OTHER): Payer: BC Managed Care – PPO

## 2023-02-09 DIAGNOSIS — J309 Allergic rhinitis, unspecified: Secondary | ICD-10-CM | POA: Diagnosis not present

## 2023-02-16 ENCOUNTER — Ambulatory Visit (INDEPENDENT_AMBULATORY_CARE_PROVIDER_SITE_OTHER): Payer: BC Managed Care – PPO | Admitting: *Deleted

## 2023-02-16 DIAGNOSIS — J309 Allergic rhinitis, unspecified: Secondary | ICD-10-CM | POA: Diagnosis not present

## 2023-03-16 ENCOUNTER — Ambulatory Visit (INDEPENDENT_AMBULATORY_CARE_PROVIDER_SITE_OTHER): Payer: BC Managed Care – PPO

## 2023-03-16 DIAGNOSIS — J309 Allergic rhinitis, unspecified: Secondary | ICD-10-CM | POA: Diagnosis not present

## 2023-04-13 ENCOUNTER — Ambulatory Visit (INDEPENDENT_AMBULATORY_CARE_PROVIDER_SITE_OTHER): Payer: BC Managed Care – PPO | Admitting: *Deleted

## 2023-04-13 DIAGNOSIS — J309 Allergic rhinitis, unspecified: Secondary | ICD-10-CM | POA: Diagnosis not present

## 2023-05-11 ENCOUNTER — Ambulatory Visit (INDEPENDENT_AMBULATORY_CARE_PROVIDER_SITE_OTHER): Payer: Self-pay

## 2023-05-11 DIAGNOSIS — J309 Allergic rhinitis, unspecified: Secondary | ICD-10-CM | POA: Diagnosis not present

## 2023-06-08 ENCOUNTER — Ambulatory Visit (INDEPENDENT_AMBULATORY_CARE_PROVIDER_SITE_OTHER): Payer: BC Managed Care – PPO | Admitting: *Deleted

## 2023-06-08 DIAGNOSIS — J309 Allergic rhinitis, unspecified: Secondary | ICD-10-CM

## 2023-07-06 ENCOUNTER — Ambulatory Visit (INDEPENDENT_AMBULATORY_CARE_PROVIDER_SITE_OTHER): Payer: Self-pay | Admitting: *Deleted

## 2023-07-06 DIAGNOSIS — J309 Allergic rhinitis, unspecified: Secondary | ICD-10-CM

## 2023-07-08 NOTE — Progress Notes (Signed)
VIALS EXP 09-17-23

## 2023-07-09 DIAGNOSIS — J3081 Allergic rhinitis due to animal (cat) (dog) hair and dander: Secondary | ICD-10-CM | POA: Diagnosis not present

## 2023-08-04 ENCOUNTER — Ambulatory Visit (INDEPENDENT_AMBULATORY_CARE_PROVIDER_SITE_OTHER): Payer: BC Managed Care – PPO

## 2023-08-04 DIAGNOSIS — J309 Allergic rhinitis, unspecified: Secondary | ICD-10-CM | POA: Diagnosis not present

## 2023-08-31 ENCOUNTER — Ambulatory Visit (INDEPENDENT_AMBULATORY_CARE_PROVIDER_SITE_OTHER): Payer: BC Managed Care – PPO | Admitting: *Deleted

## 2023-08-31 DIAGNOSIS — J309 Allergic rhinitis, unspecified: Secondary | ICD-10-CM

## 2023-09-08 ENCOUNTER — Ambulatory Visit (INDEPENDENT_AMBULATORY_CARE_PROVIDER_SITE_OTHER): Payer: BC Managed Care – PPO

## 2023-09-08 DIAGNOSIS — J309 Allergic rhinitis, unspecified: Secondary | ICD-10-CM

## 2023-09-14 ENCOUNTER — Ambulatory Visit (INDEPENDENT_AMBULATORY_CARE_PROVIDER_SITE_OTHER): Payer: BC Managed Care – PPO | Admitting: *Deleted

## 2023-09-14 DIAGNOSIS — J309 Allergic rhinitis, unspecified: Secondary | ICD-10-CM

## 2023-10-12 ENCOUNTER — Ambulatory Visit (INDEPENDENT_AMBULATORY_CARE_PROVIDER_SITE_OTHER): Payer: BC Managed Care – PPO | Admitting: *Deleted

## 2023-10-12 DIAGNOSIS — J309 Allergic rhinitis, unspecified: Secondary | ICD-10-CM

## 2023-11-09 ENCOUNTER — Ambulatory Visit (INDEPENDENT_AMBULATORY_CARE_PROVIDER_SITE_OTHER): Payer: Self-pay | Admitting: *Deleted

## 2023-11-09 DIAGNOSIS — J309 Allergic rhinitis, unspecified: Secondary | ICD-10-CM

## 2023-12-07 ENCOUNTER — Ambulatory Visit (INDEPENDENT_AMBULATORY_CARE_PROVIDER_SITE_OTHER): Payer: Self-pay

## 2023-12-07 DIAGNOSIS — J309 Allergic rhinitis, unspecified: Secondary | ICD-10-CM | POA: Diagnosis not present

## 2023-12-23 NOTE — Progress Notes (Unsigned)
 Patient ID: Jose Mahoney, male    DOB: 12/30/1965, 58 y.o.   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 Allergy  vacine here in past, now managed by Dr Jerelene Monday  ==========================================================================================================   12/24/22- - 56 yoM former smoker followed for Insomnia, complicated by  Allergic Rhinitis (Dr Jerelene Monday), Asthma,  Urticaria,  -Temazepam  15, Benadryl  Covid vax-2 Moderna Flu vax-no Body weight today - 202 lbs He seems mainly now to associate difficulty sleeping with intervals of stress.  He only uses temazepam  when needed, 1 Or sometimes 2.  We discussed options to change to something else or to increase to 30 mg temazepam  but he is satisfied for now to state where he is.  Denies other changes or problems with his overall health.  Asthma and allergic rhinitis are still managed by his allergist.  12/23/23-  57 yoM former smoker followed for Insomnia, complicated by  Allergic Rhinitis (Dr Jerelene Monday), Asthma,  Urticaria,  -Temazepam  15, zyrtec  Discussed the use of AI scribe software for clinical note transcription with the patient, who gave verbal consent to proceed.  History of Present Illness   The patient, with a history of allergies and insomnia, presents for a routine follow-up. He continues to receive allergy  shots and take Zyrtec  daily. He has an upcoming annual follow-up with his allergist, Dr. Kozlow.  For his insomnia, he has been taking temazepam , which has been effective. However, he ran out of the medication a few weeks prior to this visit and has been managing without it. He expresses satisfaction with the current prescription plan and requests a refill.     Pt states he is doing well  ROS-see HPI    + = positive Constitutional:     weight loss, no- night sweats, fevers, chills, fatigue,  lassitude. HEENT:   No-  headaches, difficulty swallowing, tooth/dental problems,  sore throat,       No-  sneezing, itching, ear ache,  congestion, post nasal drip,  CV:  No-   chest pain, orthopnea, PND, swelling in lower extremities, anasarca, dizziness, palpitations Resp: No-   shortness of breath with exertion or at rest.              No-   productive cough,   non-productive cough,  No- coughing up of blood.      No-   change in color of mucus.  No-occasional wheezing.   Skin: HPI GI:  No-   heartburn, indigestion, abdominal pain, nausea, vomiting,  GU:  MS:  No-   joint pain or swelling.   Neuro-     nothing unusual Psych:  No- change in mood or affect. No depression or anxiety.  No memory loss.  Objective:   Physical Exam General- Alert, Oriented, Affect-appropriate, Distress- none acute, +looks fit Skin- ,excoriation- none, No rash demonstrated at this visit.  Lymphadenopathy- none Head- atraumatic            Eyes- Gross vision intact, PERRLA, conjunctivae clear secretions            Ears- Hearing, canals            Nose-  No-Septal dev, mucus, polyps, erosion, perforation. nasal  Throat- Mallampati III , mucosa-Clear, drainage -none, tonsils- atrophic Neck- flexible , trachea midline, no stridor , thyroid nl, carotid no bruit Chest - symmetrical excursion , unlabored  Heart/CV- RRR , no murmur , no gallop  , no rub, nl s1 s2                           - JVD- none , edema- none, stasis changes- none, varices- none           Lung- clear to P&A, wheeze- none, cough-None , dullness-none, rub- none           Chest wall-  Abd-  Br/ Gen/ Rectal- Not done, not indicated Extrem- cyanosis- none, clubbing, none, atrophy- none, strength- nl Neuro- grossly intact to observation   Assessment and Plan:    Allergic rhinitis Well-managed with allergy  shots and cetirizine . - Continue allergy  shots. - Continue cetirizine . - Follow up with Dr. Kandi Oris next week for annual  review.  Insomnia Managed with temazepam . He ran out of medication a few weeks ago. - Refill temazepam  prescription at [facility] on [address]. - Discuss future management options with primary care or sleep specialist due to provider's upcoming retirement.

## 2023-12-24 ENCOUNTER — Encounter: Payer: Self-pay | Admitting: Internal Medicine

## 2023-12-24 ENCOUNTER — Ambulatory Visit: Payer: BC Managed Care – PPO | Admitting: Internal Medicine

## 2023-12-24 VITALS — BP 122/80 | HR 87 | Temp 98.1°F | Ht 69.0 in | Wt 192.8 lb

## 2023-12-24 DIAGNOSIS — G4700 Insomnia, unspecified: Secondary | ICD-10-CM | POA: Diagnosis not present

## 2023-12-24 DIAGNOSIS — J309 Allergic rhinitis, unspecified: Secondary | ICD-10-CM | POA: Diagnosis not present

## 2023-12-24 DIAGNOSIS — Z87891 Personal history of nicotine dependence: Secondary | ICD-10-CM | POA: Diagnosis not present

## 2023-12-24 MED ORDER — TEMAZEPAM 15 MG PO CAPS: ORAL_CAPSULE | ORAL | 5 refills | Status: AC

## 2023-12-24 NOTE — Patient Instructions (Signed)
Temazepam refilled  Please call if we can help 

## 2023-12-28 ENCOUNTER — Ambulatory Visit (INDEPENDENT_AMBULATORY_CARE_PROVIDER_SITE_OTHER): Payer: BC Managed Care – PPO | Admitting: Allergy and Immunology

## 2023-12-28 VITALS — BP 114/76 | HR 73 | Temp 98.2°F | Resp 14 | Ht 69.0 in | Wt 192.8 lb

## 2023-12-28 DIAGNOSIS — L508 Other urticaria: Secondary | ICD-10-CM

## 2023-12-28 DIAGNOSIS — J301 Allergic rhinitis due to pollen: Secondary | ICD-10-CM

## 2023-12-28 DIAGNOSIS — J3089 Other allergic rhinitis: Secondary | ICD-10-CM

## 2023-12-28 MED ORDER — CETIRIZINE HCL 10 MG PO TABS: 20.0000 mg | ORAL_TABLET | Freq: Two times a day (BID) | ORAL | 3 refills | Status: AC

## 2023-12-28 MED ORDER — NEFFY 2 MG/0.1ML NA SOLN: 1.0000 | NASAL | 1 refills | Status: AC | PRN

## 2023-12-28 NOTE — Patient Instructions (Signed)
  1. Continue Zyrtec  up to 40 mg daily  2. Continue immunotherapy (NEFFY)  3.  Return to clinic in 1 year or earlier if problem  4.  Influenza = Tamiflu. Covid = Paxlovid

## 2023-12-28 NOTE — Progress Notes (Unsigned)
 [place] - [place] - [place] - [place] - [place]   Follow-up Note  Referring Provider: Genia Kettering, MD Primary Provider: Genia Kettering, MD Date of Office Visit: 12/28/2023  Subjective:   Jose Mahoney (DOB: Aug 13, 1966) is a 57 y.o. male who [facility] on 12/28/2023 in re-evaluation of the following:  HPI: Jose Mahoney returns to this clinic in evaluation of allergic rhinitis and history of urticaria.  I last saw him in this clinic 29 December 2022.  He continues on immunotherapy every 4 weeks without any adverse effect.  Immunotherapy and a combination of cetirizine  at 20 mg a day has resulted in complete control of all of his nasal issues and his urticaria.  Allergies as of 12/28/2023       Reactions   Flonase [fluticasone  Propionate] Other (See Comments)   Headaches        Medication List    anastrozole 1 MG tablet Commonly known as: ARIMIDEX 1/2 MG every other  week   cetirizine  10 MG tablet Commonly known as: ZYRTEC  Take 2 tablets (20 mg total) by mouth 2 (two) times daily.   diphenhydrAMINE 25 mg capsule Commonly known as: BENADRYL Take 25 mg by mouth at bedtime as needed.   EPINEPHrine  0.3 mg/0.3 mL Soaj injection Commonly known as: EPI-PEN Inject 0.3 mg into the muscle as needed for anaphylaxis. Inject into thigh if needed for severe allergic reaction   NONFORMULARY OR COMPOUNDED ITEM Allergy  Vaccine AB 1:10/ C 1:50 Given at [facility]   temazepam  15 MG capsule Commonly known as: RESTORIL  1-2 caps for sleep as needed   testosterone  cypionate 200 MG/ML injection Commonly known as: DEPOTESTOSTERONE CYPIONATE 0.6 weekly    Past Medical History:  Diagnosis Date   Allergic rhinitis    Asthma    Chronic insomnia    Male hypogonadism    Neck pain    Recurrent upper respiratory infection (URI)    Urticaria     Past Surgical History:  Procedure Laterality Date   CYSTECTOMY     ORCHIECTOMY      TESTICLE TORSION REDUCTION     Left     Review of systems negative except as noted in HPI / PMHx or noted below:  Review of Systems  Constitutional: Negative.   HENT: Negative.    Eyes: Negative.   Respiratory: Negative.    Cardiovascular: Negative.   Gastrointestinal: Negative.   Genitourinary: Negative.   Musculoskeletal: Negative.   Skin: Negative.   Neurological: Negative.   Endo/Heme/Allergies: Negative.   Psychiatric/Behavioral: Negative.       Objective:   Vitals:   12/28/23 0902  BP: 114/76  Pulse: 73  Resp: 14  Temp: 98.2 F (36.8 C)  SpO2: 99%   Height: 5\' 9"  (175.3 cm)  Weight: 192 lb 12.8 oz (87.5 kg)   Physical Exam Constitutional:      Appearance: He is not diaphoretic.  HENT:     Head: Normocephalic.     Right Ear: Tympanic membrane, ear canal and external ear normal.     Left Ear: Tympanic membrane, ear canal and external ear normal.     Nose: Nose normal. No mucosal edema or rhinorrhea.     Mouth/Throat:     Pharynx: Uvula midline. No oropharyngeal exudate.  Eyes:     Conjunctiva/sclera: Conjunctivae normal.  Neck:     Thyroid: No thyromegaly.     Trachea: Trachea normal. No tracheal tenderness or tracheal deviation.  Cardiovascular:  Rate and Rhythm: Normal rate and regular rhythm.     Heart sounds: Normal heart sounds, S1 normal and S2 normal. No murmur heard. Pulmonary:     Effort: No respiratory distress.     Breath sounds: Normal breath sounds. No stridor. No wheezing or rales.  Lymphadenopathy:     Head:     Right side of head: No tonsillar adenopathy.     Left side of head: No tonsillar adenopathy.     Cervical: No cervical adenopathy.  Skin:    Findings: No erythema or rash.     Nails: There is no clubbing.  Neurological:     Mental Status: He is alert.     Diagnostics: none  Assessment and Plan:   1. Perennial allergic rhinitis   2. Seasonal allergic rhinitis due to pollen   3. Chronic urticaria    1.  Continue Zyrtec  up to 40 mg daily  2. Continue immunotherapy (NEFFY)  3.  Return to clinic in 1 year or earlier if problem  4.  Influenza = Tamiflu. Covid = Paxlovid   Jose Mahoney is doing quite well on his current plan of using immunotherapy along with a H1 receptor blocker to control his atopic respiratory disease and chronic urticaria.  He will continue on this plan we will see him back in this clinic in 1 year or earlier if there is a problem.   Jose Custard, MD Allergy  / Immunology [facility]

## 2023-12-29 ENCOUNTER — Encounter: Payer: Self-pay | Admitting: Allergy and Immunology

## 2024-01-04 ENCOUNTER — Ambulatory Visit (INDEPENDENT_AMBULATORY_CARE_PROVIDER_SITE_OTHER): Payer: Self-pay

## 2024-01-04 DIAGNOSIS — J309 Allergic rhinitis, unspecified: Secondary | ICD-10-CM

## 2024-01-25 ENCOUNTER — Ambulatory Visit (INDEPENDENT_AMBULATORY_CARE_PROVIDER_SITE_OTHER): Payer: Self-pay

## 2024-01-25 DIAGNOSIS — J309 Allergic rhinitis, unspecified: Secondary | ICD-10-CM | POA: Diagnosis not present

## 2024-02-22 ENCOUNTER — Ambulatory Visit (INDEPENDENT_AMBULATORY_CARE_PROVIDER_SITE_OTHER)

## 2024-02-22 DIAGNOSIS — J309 Allergic rhinitis, unspecified: Secondary | ICD-10-CM | POA: Diagnosis not present

## 2024-02-28 DIAGNOSIS — J3081 Allergic rhinitis due to animal (cat) (dog) hair and dander: Secondary | ICD-10-CM | POA: Diagnosis not present

## 2024-02-28 NOTE — Progress Notes (Signed)
 VIALS MADE 02-28-24

## 2024-02-29 DIAGNOSIS — J3089 Other allergic rhinitis: Secondary | ICD-10-CM | POA: Diagnosis not present

## 2024-03-21 ENCOUNTER — Ambulatory Visit (INDEPENDENT_AMBULATORY_CARE_PROVIDER_SITE_OTHER)

## 2024-03-21 DIAGNOSIS — J309 Allergic rhinitis, unspecified: Secondary | ICD-10-CM

## 2024-04-19 ENCOUNTER — Ambulatory Visit (INDEPENDENT_AMBULATORY_CARE_PROVIDER_SITE_OTHER)

## 2024-04-19 DIAGNOSIS — J309 Allergic rhinitis, unspecified: Secondary | ICD-10-CM

## 2024-05-16 ENCOUNTER — Ambulatory Visit (INDEPENDENT_AMBULATORY_CARE_PROVIDER_SITE_OTHER)

## 2024-05-16 DIAGNOSIS — J309 Allergic rhinitis, unspecified: Secondary | ICD-10-CM | POA: Diagnosis not present

## 2024-06-13 ENCOUNTER — Ambulatory Visit (INDEPENDENT_AMBULATORY_CARE_PROVIDER_SITE_OTHER)

## 2024-06-13 DIAGNOSIS — J309 Allergic rhinitis, unspecified: Secondary | ICD-10-CM

## 2024-07-11 ENCOUNTER — Ambulatory Visit (INDEPENDENT_AMBULATORY_CARE_PROVIDER_SITE_OTHER)

## 2024-07-11 DIAGNOSIS — J309 Allergic rhinitis, unspecified: Secondary | ICD-10-CM | POA: Diagnosis not present

## 2024-07-29 ENCOUNTER — Ambulatory Visit
Admission: RE | Admit: 2024-07-29 | Discharge: 2024-07-29 | Disposition: A | Discharge status: Home / Self Care | Source: Ambulatory Visit | Attending: Internal Medicine | Admitting: Internal Medicine

## 2024-07-29 ENCOUNTER — Other Ambulatory Visit: Payer: Self-pay

## 2024-07-29 VITALS — BP 134/86 | HR 76 | Temp 97.8°F | Resp 17

## 2024-07-29 DIAGNOSIS — S0181XA Laceration without foreign body of other part of head, initial encounter: Secondary | ICD-10-CM

## 2024-07-29 DIAGNOSIS — Z4802 Encounter for removal of sutures: Secondary | ICD-10-CM

## 2024-07-29 MED ORDER — CEPHALEXIN 500 MG PO CAPS: 500.0000 mg | ORAL_CAPSULE | Freq: Three times a day (TID) | ORAL | 0 refills | Status: AC

## 2024-07-29 NOTE — ED Provider Notes (Signed)
 [facility]    CSN: [id number] Arrival date & time: 07/29/24  1116      History   Chief Complaint Chief Complaint  Patient presents with   Suture / Staple Removal    HPI Jose Mahoney is a 58 y.o. male.   Patient presents for suture removal to laceration of forehead.  He was seen at [facility] 7 days ago due to laceration to head from running into a fence.  He had 9 sutures placed.  Denies concerns at this time.   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 Authorizing Provider  cephALEXin (KEFLEX) 500 MG capsule Take 1 capsule (500 mg total) by mouth 3 (three) times daily for 5 days. 07/29/24 08/03/24 Yes Shalamar Crays, Darryle BRAVO, FNP  anastrozole (ARIMIDEX) 1 MG tablet 1/2 MG every other  week 09/05/19   [provider]  cetirizine  (ZYRTEC ) 10 MG tablet Take 2 tablets (20 mg total) by mouth 2 (two) times daily. Do not exceed forty milligrams. 12/28/23   Kozlow, Eric J, MD  diphenhydrAMINE (BENADRYL) 25 mg capsule Take 25 mg by mouth at bedtime as needed.    [provider]  EPINEPHrine  (NEFFY ) 2 MG/0.1ML SOLN  Place 1 spray into the nose as needed. 12/28/23   Kozlow, Camellia PARAS, MD  NONFORMULARY OR COMPOUNDED ITEM Allergy  Vaccine AB 1:10/ C 1:50 Given at [facility]    [provider]  temazepam  (RESTORIL ) 15 MG capsule 1-2 caps for sleep as needed 12/24/23   Neysa Rama D, MD  testosterone  cypionate (DEPOTESTOSTERONE CYPIONATE) 200 MG/ML injection 0.6 weekly 08/23/19   [provider]    Family History Family History  Problem Relation Age of Onset   Coronary artery disease Mother        G [place]   Heart disease Mother        quad bypass   Hypertension Mother    Allergic rhinitis Mother    Alcohol abuse Father    Heart disease Paternal Grandfather        MI   Allergies Other    Heart attack Other        Grandmother   Stroke Other        Grandmother    Social History Social History   Tobacco Use   Smoking status: Former    Current packs/day: 0.00    Average packs/day: 1.5 packs/day for 10.0 years (15.0 ttl pk-yrs)    Types: Cigarettes    Start date: 08/31/1994  Quit date: 08/31/2004    Years since quitting: 19.9   Smokeless tobacco: Never   Tobacco comments:    Quit 2006  Vaping Use   Vaping status: Never Used  Substance Use Topics   Alcohol use: Yes    Comment: occ   Drug use: No     Allergies   Flonase [fluticasone  propionate]   Review of Systems Review of Systems Per HPI  Physical Exam Triage Vital Signs ED Triage Vitals  Encounter Vitals Group     BP 07/29/24 1131 134/86     Girls Systolic BP Percentile --      Girls Diastolic BP Percentile --      Boys Systolic BP Percentile --      Boys Diastolic BP Percentile --      Pulse Rate 07/29/24 1131 76     Resp 07/29/24 1131 17     Temp 07/29/24 1131 97.8 F (36.6 C)     Temp Source 07/29/24 1131 Oral     SpO2 07/29/24 1131 97 %     Weight --      Height --      Head Circumference --      Peak Flow --      Pain Score 07/29/24 1132 0     Pain Loc --      Pain Education --       Exclude from Growth Chart --    No data found.  Updated Vital Signs BP 134/86 (BP Location: Right Arm)   Pulse 76   Temp 97.8 F (36.6 C) (Oral)   Resp 17   SpO2 97%   Visual Acuity Right Eye Distance:   Left Eye Distance:   Bilateral Distance:    Right Eye Near:   Left Eye Near:    Bilateral Near:     Physical Exam Constitutional:      General: He is not in acute distress.    Appearance: Normal appearance. He is not toxic-appearing or diaphoretic.  HENT:     Head: Normocephalic and atraumatic.      Comments: Healing laceration present to right forehead.  Small amount of yellow discoloration present to upper part of laceration.  Otherwise, wound edges are closely approximated with no bleeding.  No surrounding swelling.  9 sutures are in place. Eyes:     Extraocular Movements: Extraocular movements intact.     Conjunctiva/sclera: Conjunctivae normal.  Pulmonary:     Effort: Pulmonary effort is normal.  Neurological:     General: No focal deficit present.     Mental Status: He is alert and oriented to person, place, and time. Mental status is at baseline.  Psychiatric:        Mood and Affect: Mood normal.        Behavior: Behavior normal.        Thought Content: Thought content normal.        Judgment: Judgment normal.      UC Treatments / Results  Labs (all labs ordered are listed, but only abnormal results are displayed) Labs Reviewed - No data to display  EKG   Radiology No results found.  Procedures Procedures (including critical care time)  Medications Ordered in UC Medications - No data to display  Initial Impression / Assessment and Plan / UC Course  I have reviewed the triage vital signs and the nursing notes.  Pertinent labs & imaging results that were available during my care of the patient were reviewed by me and  considered in my medical decision making (see chart for details).     Laceration appears to be healing well but there is a  small concern for start of infection to upper part of laceration so will treat with cephalexin.  Sutures removed by clinical staff.  Encouraged patient to monitor for worsening symptoms and follow-up if they occur.  Educated patient on wound care.  Patient verbalized understanding and was agreeable with plan. Final Clinical Impressions(s) / UC Diagnoses   Final diagnoses:  Visit for suture removal  Laceration of forehead, initial encounter     Discharge Instructions      I have prescribed an antibiotic to cover for infection.  Monitor for any increase in redness, swelling, pus and follow-up if this occurs.    ED Prescriptions     Medication Sig Dispense Auth. Provider   cephALEXin (KEFLEX) 500 MG capsule Take 1 capsule (500 mg total) by mouth 3 (three) times daily for 5 days. 15 capsule [place], Syniah Berne E, [place]      PDMP not reviewed this encounter.   Hazen Darryle BRAVO, [place] 07/29/24 [id number]

## 2024-07-29 NOTE — ED Triage Notes (Signed)
 Pt was seen at [facility] ED for laceration repair of forehead 7 days ago. Here to have them removed.

## 2024-07-29 NOTE — Discharge Instructions (Signed)
 I have prescribed an antibiotic to cover for infection.  Monitor for any increase in redness, swelling, pus and follow-up if this occurs.

## 2024-08-09 ENCOUNTER — Ambulatory Visit (INDEPENDENT_AMBULATORY_CARE_PROVIDER_SITE_OTHER)

## 2024-08-09 DIAGNOSIS — J309 Allergic rhinitis, unspecified: Secondary | ICD-10-CM | POA: Diagnosis not present

## 2024-09-05 DIAGNOSIS — J309 Allergic rhinitis, unspecified: Secondary | ICD-10-CM

## 2024-10-03 ENCOUNTER — Ambulatory Visit

## 2024-10-03 DIAGNOSIS — J302 Other seasonal allergic rhinitis: Secondary | ICD-10-CM

## 2024-10-03 DIAGNOSIS — J3089 Other allergic rhinitis: Secondary | ICD-10-CM

## 2024-12-19 ENCOUNTER — Ambulatory Visit: Admitting: Allergy and Immunology
